# Patient Record
Sex: Female | Born: 1990 | Race: White | Hispanic: No | Marital: Married | Smoking: Former smoker
Health system: Southern US, Community
[De-identification: ages and names within clinical notes are randomized; demographics above are authoritative.]

## PROBLEM LIST (undated history)

## (undated) ENCOUNTER — Inpatient Hospital Stay: Payer: Self-pay

## (undated) DIAGNOSIS — R87619 Unspecified abnormal cytological findings in specimens from cervix uteri: Secondary | ICD-10-CM

## (undated) DIAGNOSIS — B9689 Other specified bacterial agents as the cause of diseases classified elsewhere: Secondary | ICD-10-CM

## (undated) DIAGNOSIS — N926 Irregular menstruation, unspecified: Secondary | ICD-10-CM

## (undated) DIAGNOSIS — N760 Acute vaginitis: Secondary | ICD-10-CM

## (undated) HISTORY — PX: APPENDECTOMY: SHX54

## (undated) HISTORY — DX: Other specified bacterial agents as the cause of diseases classified elsewhere: B96.89

## (undated) HISTORY — DX: Unspecified abnormal cytological findings in specimens from cervix uteri: R87.619

## (undated) HISTORY — DX: Other specified bacterial agents as the cause of diseases classified elsewhere: N76.0

## (undated) HISTORY — DX: Irregular menstruation, unspecified: N92.6

---

## 2013-12-03 ENCOUNTER — Emergency Department: Payer: Self-pay | Admitting: Emergency Medicine

## 2013-12-03 LAB — COMPREHENSIVE METABOLIC PANEL
ALBUMIN: 4.2 g/dL (ref 3.4–5.0)
AST: 13 U/L — AB (ref 15–37)
Alkaline Phosphatase: 63 U/L
Anion Gap: 5 — ABNORMAL LOW (ref 7–16)
BUN: 12 mg/dL (ref 7–18)
Bilirubin,Total: 1 mg/dL (ref 0.2–1.0)
CALCIUM: 9.7 mg/dL (ref 8.5–10.1)
CHLORIDE: 102 mmol/L (ref 98–107)
Co2: 30 mmol/L (ref 21–32)
Creatinine: 0.75 mg/dL (ref 0.60–1.30)
EGFR (Non-African Amer.): >60
GLUCOSE: 111 mg/dL — AB (ref 65–99)
Osmolality: 274 (ref 275–301)
POTASSIUM: 4.1 mmol/L (ref 3.5–5.1)
SGPT (ALT): 21 U/L (ref 12–78)
Sodium: 137 mmol/L (ref 136–145)
Total Protein: 8.9 g/dL — ABNORMAL HIGH (ref 6.4–8.2)

## 2013-12-03 LAB — CBC WITH DIFFERENTIAL/PLATELET
Basophil #: 0.1 10*3/uL (ref 0.0–0.1)
Basophil %: 0.6 %
Eosinophil #: 0.1 10*3/uL (ref 0.0–0.7)
Eosinophil %: 0.9 %
HCT: 44.1 % (ref 35.0–47.0)
HGB: 14.6 g/dL (ref 12.0–16.0)
Lymphocyte #: 1 10*3/uL (ref 1.0–3.6)
Lymphocyte %: 7.1 %
MCH: 29.5 pg (ref 26.0–34.0)
MCHC: 33.1 g/dL (ref 32.0–36.0)
MCV: 89 fL (ref 80–100)
MONOS PCT: 6.4 %
Monocyte #: 0.9 x10 3/mm (ref 0.2–0.9)
Neutrophil #: 11.4 10*3/uL — ABNORMAL HIGH (ref 1.4–6.5)
Neutrophil %: 85 %
PLATELETS: 284 10*3/uL (ref 150–440)
RBC: 4.94 10*6/uL (ref 3.80–5.20)
RDW: 13.2 % (ref 11.5–14.5)
WBC: 13.5 10*3/uL — AB (ref 3.6–11.0)

## 2013-12-03 LAB — URINALYSIS, COMPLETE
Bilirubin,UR: NEGATIVE
GLUCOSE, UR: NEGATIVE mg/dL (ref 0–75)
Ketone: NEGATIVE
Nitrite: NEGATIVE
Ph: 6 (ref 4.5–8.0)
Protein: NEGATIVE
SPECIFIC GRAVITY: 1.026 (ref 1.003–1.030)
WBC UR: 3 /HPF (ref 0–5)

## 2013-12-03 LAB — LIPASE, BLOOD: Lipase: 68 U/L — ABNORMAL LOW (ref 73–393)

## 2013-12-05 LAB — URINE CULTURE

## 2013-12-25 ENCOUNTER — Emergency Department: Payer: Self-pay | Admitting: Emergency Medicine

## 2013-12-25 LAB — LIPASE, BLOOD: LIPASE: 98 U/L (ref 73–393)

## 2013-12-25 LAB — GC/CHLAMYDIA PROBE AMP

## 2013-12-25 LAB — CBC
HCT: 40.5 % (ref 35.0–47.0)
HGB: 13.5 g/dL (ref 12.0–16.0)
MCH: 29.5 pg (ref 26.0–34.0)
MCHC: 33.4 g/dL (ref 32.0–36.0)
MCV: 88 fL (ref 80–100)
Platelet: 317 10*3/uL (ref 150–440)
RBC: 4.59 10*6/uL (ref 3.80–5.20)
RDW: 13 % (ref 11.5–14.5)
WBC: 9.9 10*3/uL (ref 3.6–11.0)

## 2013-12-25 LAB — URINALYSIS, COMPLETE
BILIRUBIN, UR: NEGATIVE
Bacteria: NONE SEEN
GLUCOSE, UR: NEGATIVE mg/dL (ref 0–75)
Ketone: NEGATIVE
NITRITE: NEGATIVE
Ph: 5 (ref 4.5–8.0)
Protein: NEGATIVE
RBC,UR: NONE SEEN /HPF (ref 0–5)
SPECIFIC GRAVITY: 1.03 (ref 1.003–1.030)
Squamous Epithelial: NONE SEEN
WBC UR: NONE SEEN /HPF (ref 0–5)

## 2013-12-25 LAB — COMPREHENSIVE METABOLIC PANEL
ALT: 15 U/L (ref 12–78)
AST: 21 U/L (ref 15–37)
Albumin: 3.9 g/dL (ref 3.4–5.0)
Alkaline Phosphatase: 56 U/L
Anion Gap: 6 — ABNORMAL LOW (ref 7–16)
BUN: 12 mg/dL (ref 7–18)
Bilirubin,Total: 0.3 mg/dL (ref 0.2–1.0)
CALCIUM: 9.3 mg/dL (ref 8.5–10.1)
CREATININE: 0.9 mg/dL (ref 0.60–1.30)
Chloride: 104 mmol/L (ref 98–107)
Co2: 28 mmol/L (ref 21–32)
EGFR (Non-African Amer.): >60
Glucose: 95 mg/dL (ref 65–99)
Osmolality: 275 (ref 275–301)
Potassium: 3.8 mmol/L (ref 3.5–5.1)
Sodium: 138 mmol/L (ref 136–145)
Total Protein: 8.2 g/dL (ref 6.4–8.2)

## 2013-12-25 LAB — WET PREP, GENITAL

## 2013-12-25 LAB — HCG, QUANTITATIVE, PREGNANCY: Beta Hcg, Quant.: <1 m[IU]/mL — ABNORMAL LOW

## 2014-12-04 DIAGNOSIS — J45909 Unspecified asthma, uncomplicated: Secondary | ICD-10-CM | POA: Insufficient documentation

## 2014-12-20 ENCOUNTER — Emergency Department
Admission: EM | Admit: 2014-12-20 | Discharge: 2014-12-21 | Disposition: A | Discharge status: Home / Self Care | Attending: Emergency Medicine | Admitting: Emergency Medicine

## 2014-12-20 DIAGNOSIS — R109 Unspecified abdominal pain: Secondary | ICD-10-CM | POA: Diagnosis present

## 2014-12-20 DIAGNOSIS — R52 Pain, unspecified: Secondary | ICD-10-CM

## 2014-12-20 DIAGNOSIS — Z3202 Encounter for pregnancy test, result negative: Secondary | ICD-10-CM | POA: Diagnosis not present

## 2014-12-20 DIAGNOSIS — K529 Noninfective gastroenteritis and colitis, unspecified: Secondary | ICD-10-CM

## 2014-12-20 LAB — CBC WITH DIFFERENTIAL/PLATELET
Basophils Absolute: 0.1 10*3/uL (ref 0–0.1)
Basophils Relative: 1 %
Eosinophils Absolute: 0.3 10*3/uL (ref 0–0.7)
Eosinophils Relative: 2 %
HEMATOCRIT: 44.9 % (ref 35.0–47.0)
HEMOGLOBIN: 14.6 g/dL (ref 12.0–16.0)
Lymphocytes Relative: 27 %
Lymphs Abs: 3.2 10*3/uL (ref 1.0–3.6)
MCH: 28.7 pg (ref 26.0–34.0)
MCHC: 32.4 g/dL (ref 32.0–36.0)
MCV: 88.6 fL (ref 80.0–100.0)
MONOS PCT: 6 %
Monocytes Absolute: 0.8 10*3/uL (ref 0.2–0.9)
NEUTROS ABS: 7.6 10*3/uL — AB (ref 1.4–6.5)
Neutrophils Relative %: 64 %
Platelets: 342 10*3/uL (ref 150–440)
RBC: 5.06 MIL/uL (ref 3.80–5.20)
RDW: 13.1 % (ref 11.5–14.5)
WBC: 12 10*3/uL — ABNORMAL HIGH (ref 3.6–11.0)

## 2014-12-20 LAB — COMPREHENSIVE METABOLIC PANEL
ALK PHOS: 54 U/L (ref 38–126)
ALT: 14 U/L (ref 14–54)
AST: 21 U/L (ref 15–41)
Albumin: 4.3 g/dL (ref 3.5–5.0)
Anion gap: 10 (ref 5–15)
BUN: 10 mg/dL (ref 6–20)
CHLORIDE: 100 mmol/L — AB (ref 101–111)
CO2: 30 mmol/L (ref 22–32)
Calcium: 9.6 mg/dL (ref 8.9–10.3)
Creatinine, Ser: 0.83 mg/dL (ref 0.44–1.00)
Glucose, Bld: 103 mg/dL — ABNORMAL HIGH (ref 65–99)
Potassium: 4 mmol/L (ref 3.5–5.1)
Sodium: 140 mmol/L (ref 135–145)
Total Bilirubin: 0.6 mg/dL (ref 0.3–1.2)
Total Protein: 8.4 g/dL — ABNORMAL HIGH (ref 6.5–8.1)

## 2014-12-20 LAB — URINALYSIS COMPLETE WITH MICROSCOPIC (ARMC ONLY)
BACTERIA UA: NONE SEEN
BILIRUBIN URINE: NEGATIVE
GLUCOSE, UA: NEGATIVE mg/dL
HGB URINE DIPSTICK: NEGATIVE
Ketones, ur: NEGATIVE mg/dL
NITRITE: NEGATIVE
Protein, ur: NEGATIVE mg/dL
Specific Gravity, Urine: 1.029 (ref 1.005–1.030)
pH: 7 (ref 5.0–8.0)

## 2014-12-20 LAB — LIPASE, BLOOD: Lipase: 30 U/L (ref 22–51)

## 2014-12-20 LAB — POCT PREGNANCY, URINE: Preg Test, Ur: NEGATIVE

## 2014-12-20 MED ORDER — SODIUM CHLORIDE 0.9 % IV BOLUS (SEPSIS)
1000.0000 mL | Freq: Once | INTRAVENOUS | Status: AC
Start: 2014-12-21 — End: 2014-12-21
  Administered 2014-12-21: 1000 mL via INTRAVENOUS

## 2014-12-20 MED ORDER — DICYCLOMINE HCL 10 MG PO CAPS
10.0000 mg | ORAL_CAPSULE | Freq: Once | ORAL | Status: AC
  Administered 2014-12-21: 10 mg via ORAL

## 2014-12-20 NOTE — ED Notes (Signed)
Patient c/o feeling of acid reflux to mid-chest and nausea and epigastric pain. Patient describes feeling as "like you ate something bad," and reports the pain is constant. Patient reports one episode of diarrhea and states she has never vomited.

## 2014-12-20 NOTE — ED Notes (Signed)
PT arrived to ED with reported abdominal pain and nausea x 1 day. Pt c/o feeling "achy and bloated". Pt reports pain is constant. Rates pain 6/10.

## 2014-12-20 NOTE — ED Provider Notes (Signed)
[facility] Emergency Department Provider Note  ____________________________________________  Time seen: 11:40 PM  I have reviewed the triage vital signs and the nursing notes.   HISTORY  Chief Complaint Abdominal Pain      HPI Lindsay Carpenter is a 24 y.o. female presents with cramp-like generalized abdominal discomfort 1 day. In addition patient admits to diarrhea this morning. Patient denies fever no vomiting. Of note patient's child recently had similar illness.     History reviewed. No pertinent past medical history.  There are no active problems to display for this patient.   History reviewed. No pertinent past surgical history.  No current outpatient prescriptions on file.  Allergies Motrin  No family history on file.  Social History History  Substance Use Topics  . Smoking status: Never Smoker   . Smokeless tobacco: Never Used  . Alcohol Use: No    Review of Systems  Constitutional: Negative for fever. Eyes: Negative for visual changes. ENT: Negative for sore throat. Cardiovascular: Negative for chest pain. Respiratory: Negative for shortness of breath. Gastrointestinal: Positive for abdominal pain,  positive diarrhea. Genitourinary: Negative for dysuria. Musculoskeletal: Negative for back pain. Skin: Negative for rash. Neurological: Negative for headaches, focal weakness or numbness.   10-point ROS otherwise negative.  ____________________________________________   PHYSICAL EXAM:  VITAL SIGNS: ED Triage Vitals  Enc Vitals Group     BP 12/20/14 2105 112/72 mmHg     Pulse Rate 12/20/14 2105 60     Resp 12/20/14 2105 16     Temp 12/20/14 2105 98.1 F (36.7 C)     Temp Source 12/20/14 2105 Oral     SpO2 12/20/14 2105 99 %     Weight 12/20/14 2105 184 lb (83.462 kg)     Height 12/20/14 2105 5\' 8"  (1.727 m)     Head Cir --      Peak Flow --      Pain Score --      Pain Loc --      Pain Edu? --      Excl. in  GC? --     Constitutional: Alert and oriented. Well appearing and in no distress. Eyes: Conjunctivae are normal. PERRL. Normal extraocular movements. ENT   Head: Normocephalic and atraumatic.   Nose: No congestion/rhinnorhea.   Mouth/Throat: Mucous membranes are moist.   Neck: No stridor. Cardiovascular: Normal rate, regular rhythm. Normal and symmetric distal pulses are present in all extremities. No murmurs, rubs, or gallops. Respiratory: Normal respiratory effort without tachypnea nor retractions. Breath sounds are clear and equal bilaterally. No wheezes/rales/rhonchi. Gastrointestinal: Soft and nontender. No distention. There is no CVA tenderness. Genitourinary: deferred Musculoskeletal: Nontender with normal range of motion in all extremities. No joint effusions.  No lower extremity tenderness nor edema. Neurologic:  Normal speech and language. No gross focal neurologic deficits are appreciated. Speech is normal.  Skin:  Skin is warm, dry and intact. No rash noted. Psychiatric: Mood and affect are normal. Speech and behavior are normal. Patient exhibits appropriate insight and judgment.  ____________________________________________    LABS (pertinent positives/negatives)  Labs Reviewed  COMPREHENSIVE METABOLIC PANEL - Abnormal; Notable for the following:    Chloride 100 (*)    Glucose, Bld 103 (*)    Total Protein 8.4 (*)    All other components within normal limits  URINALYSIS COMPLETEWITH MICROSCOPIC ([facility])  - Abnormal; Notable for the following:    Color, Urine YELLOW (*)    APPearance HAZY (*)    Leukocytes, UA  TRACE (*)    Squamous Epithelial / LPF 6-30 (*)    All other components within normal limits  CBC WITH DIFFERENTIAL/PLATELET - Abnormal; Notable for the following:    WBC 12.0 (*)    Neutro Abs 7.6 (*)    All other components within normal limits  LIPASE, BLOOD  POC URINE PREG, ED  POCT PREGNANCY, URINE      ____________________________________________     RADIOLOGY  Right upper quadrant ultrasound negative  ____________________________________________      INITIAL IMPRESSION / ASSESSMENT AND PLAN / ED COURSE  Pertinent labs & imaging results that were available during my care of the patient were reviewed by me and considered in my medical decision making (see chart for details).  History and physical exam consistent with possible gastroenteritis. However we'll perform ultrasound of the gallbladder given right upper quadrant pain and family history of gallstones which was negative  ____________________________________________   FINAL CLINICAL IMPRESSION(S) / ED DIAGNOSES  Final diagnoses:  Pain  Acute gastroenteritis      Darci Currentandolph N Brown, MD 01/03/15 [phone]

## 2014-12-21 ENCOUNTER — Emergency Department

## 2014-12-21 MED ORDER — DICYCLOMINE HCL 10 MG PO CAPS
ORAL_CAPSULE | ORAL | Status: AC
  Administered 2014-12-21: 10 mg via ORAL
  Filled 2014-12-21: qty 1

## 2014-12-21 NOTE — Discharge Instructions (Signed)

## 2014-12-25 ENCOUNTER — Encounter: Payer: Self-pay | Admitting: Urgent Care

## 2014-12-25 DIAGNOSIS — S82891A Other fracture of right lower leg, initial encounter for closed fracture: Secondary | ICD-10-CM | POA: Insufficient documentation

## 2014-12-25 DIAGNOSIS — Y998 Other external cause status: Secondary | ICD-10-CM | POA: Insufficient documentation

## 2014-12-25 DIAGNOSIS — Y9289 Other specified places as the place of occurrence of the external cause: Secondary | ICD-10-CM | POA: Insufficient documentation

## 2014-12-25 DIAGNOSIS — W109XXA Fall (on) (from) unspecified stairs and steps, initial encounter: Secondary | ICD-10-CM | POA: Insufficient documentation

## 2014-12-25 DIAGNOSIS — Y9389 Activity, other specified: Secondary | ICD-10-CM | POA: Insufficient documentation

## 2014-12-25 NOTE — ED Notes (Signed)
Patient presents with c/o RIGHT ankle pain s/p falling down stairs. Patient with (+) PMS noted; foot warm and dry.

## 2014-12-26 ENCOUNTER — Encounter: Payer: Self-pay | Admitting: Emergency Medicine

## 2014-12-26 ENCOUNTER — Emergency Department

## 2014-12-26 ENCOUNTER — Emergency Department
Admission: EM | Admit: 2014-12-26 | Discharge: 2014-12-26 | Disposition: A | Discharge status: Home / Self Care | Attending: Emergency Medicine | Admitting: Emergency Medicine

## 2014-12-26 DIAGNOSIS — T148XXA Other injury of unspecified body region, initial encounter: Secondary | ICD-10-CM

## 2014-12-26 DIAGNOSIS — S82891A Other fracture of right lower leg, initial encounter for closed fracture: Secondary | ICD-10-CM

## 2014-12-26 MED ORDER — FENTANYL CITRATE (PF) 100 MCG/2ML IJ SOLN
INTRAMUSCULAR | Status: AC
Start: 2014-12-26 — End: 2014-12-26
  Administered 2014-12-26: 25 ug via INTRAMUSCULAR
  Filled 2014-12-26: qty 2

## 2014-12-26 MED ORDER — ONDANSETRON 4 MG PO TBDP
4.0000 mg | ORAL_TABLET | Freq: Once | ORAL | Status: AC
  Administered 2014-12-26: 4 mg via ORAL

## 2014-12-26 MED ORDER — OXYCODONE-ACETAMINOPHEN 5-325 MG PO TABS: 1.0000 | ORAL_TABLET | ORAL | Status: DC | PRN

## 2014-12-26 MED ORDER — ONDANSETRON 4 MG PO TBDP
ORAL_TABLET | ORAL | Status: AC
  Administered 2014-12-26: 4 mg via ORAL
  Filled 2014-12-26: qty 1

## 2014-12-26 MED ORDER — FENTANYL CITRATE (PF) 100 MCG/2ML IJ SOLN
25.0000 ug | Freq: Once | INTRAMUSCULAR | Status: AC
  Administered 2014-12-26: 25 ug via INTRAMUSCULAR

## 2014-12-26 NOTE — Discharge Instructions (Signed)
Ankle Fracture  A fracture is a break in a bone. The ankle joint is made up of three bones. These include the lower (distal)sections of your lower leg bones, called the tibia and fibula, along with a bone in your foot, called the talus. Depending on how bad the break is and if more than one ankle joint bone is broken, a cast or splint is used to protect and keep your injured bone from moving while it heals. Sometimes, surgery is required to help the fracture heal properly.   There are two general types of fractures:   Stable fracture. This includes a single fracture line through one bone, with no injury to ankle ligaments. A fracture of the talus that does not have any displacement (movement of the bone on either side of the fracture line) is also stable.   Unstable fracture. This includes more than one fracture line through one or more bones in the ankle joint. It also includes fractures that have displacement of the bone on either side of the fracture line.  CAUSES   A direct blow to the ankle.    Quickly and severely twisting your ankle.   Trauma, such as a car accident or falling from a significant height.  RISK FACTORS  You may be at a higher risk of ankle fracture if:   You have certain medical conditions.   You are involved in high-impact sports.   You are involved in a high-impact car accident.  SIGNS AND SYMPTOMS    Tender and swollen ankle.   Bruising around the injured ankle.   Pain on movement of the ankle.   Difficulty walking or putting weight on the ankle.   A cold foot below the site of the ankle injury. This can occur if the blood vessels passing through your injured ankle were also damaged.   Numbness in the foot below the site of the ankle injury.  DIAGNOSIS   An ankle fracture is usually diagnosed with a physical exam and X-rays. A CT scan may also be required for complex fractures.  TREATMENT   Stable fractures are treated with a cast or splint and using crutches to avoid putting  weight on your injured ankle. This is followed by an ankle strengthening program. Some patients require a special type of cast, depending on other medical problems they may have. Unstable fractures require surgery to ensure the bones heal properly. Your health care provider will tell you what type of fracture you have and the best treatment for your condition.  HOME CARE INSTRUCTIONS    Review correct crutch use with your health care provider and use your crutches as directed. Safe use of crutches is extremely important. Misuse of crutches can cause you to fall or cause injury to nerves in your hands or armpits.   Do not put weight or pressure on the injured ankle until directed by your health care provider.   To lessen the swelling, keep the injured leg elevated while sitting or lying down.   Apply ice to the injured area:   Put ice in a plastic bag.   Place a towel between your cast and the bag.   Leave the ice on for 20 minutes, 2-3 times a day.   If you have a plaster or fiberglass cast:   Do not try to scratch the skin under the cast with any objects. This can increase your risk of skin infection.   Check the skin around the cast every day. You   may put lotion on any red or sore areas.   Keep your cast dry and clean.   If you have a plaster splint:   Wear the splint as directed.   You may loosen the elastic around the splint if your toes become numb, tingle, or turn cold or blue.   Do not put pressure on any part of your cast or splint; it may break. Rest your cast only on a pillow the first 24 hours until it is fully hardened.   Your cast or splint can be protected during bathing with a plastic bag sealed to your skin with medical tape. Do not lower the cast or splint into water.   Take medicines as directed by your health care provider. Only take over-the-counter or prescription medicines for pain, discomfort, or fever as directed by your health care provider.   Do not drive a vehicle until  your health care provider specifically tells you it is safe to do so.   If your health care provider has given you a follow-up appointment, it is very important to keep that appointment. Not keeping the appointment could result in a chronic or permanent injury, pain, and disability. If you have any problem keeping the appointment, call the facility for assistance.  SEEK MEDICAL CARE IF:  You develop increased swelling or discomfort.  SEEK IMMEDIATE MEDICAL CARE IF:    Your cast gets damaged or breaks.   You have continued severe pain.   You develop new pain or swelling after the cast was put on.   Your skin or toenails below the injury turn blue or gray.   Your skin or toenails below the injury feel cold, numb, or have loss of sensitivity to touch.   There is a bad smell or pus draining from under the cast.  MAKE SURE YOU:    Understand these instructions.   Will watch your condition.   Will get help right away if you are not doing well or get worse.  Document Released: 07/17/2000 Document Revised: 07/25/2013 Document Reviewed: 02/16/2013  [employer] Patient Information 2015 [employer]. This information is not intended to replace advice given to you by your health care provider. Make sure you discuss any questions you have with your health care provider.

## 2014-12-26 NOTE — ED Provider Notes (Signed)
[facility] Emergency Department Provider Note  ____________________________________________  Time seen: 3:15 AM  I have reviewed the triage vital signs and the nursing notes.   HISTORY  Chief Complaint Ankle Pain      HPI Lindsay Carpenter is a 24 y.o. female presents with sharp 10 out of 10 right ankle pain status post falling off a step twisting ankle.     History reviewed. No pertinent past medical history.  There are no active problems to display for this patient.   Past Surgical History  Procedure Laterality Date  . Cesarean section      2014    No current outpatient prescriptions on file.  Allergies Motrin  No family history on file.  Social History History  Substance Use Topics  . Smoking status: Never Smoker   . Smokeless tobacco: Never Used  . Alcohol Use: No    Review of Systems  Constitutional: Negative for fever. Eyes: Negative for visual changes. ENT: Negative for sore throat. Cardiovascular: Negative for chest pain. Respiratory: Negative for shortness of breath. Gastrointestinal: Negative for abdominal pain, vomiting and diarrhea. Genitourinary: Negative for dysuria. Musculoskeletal: Negative for back pain. Positive right ankle pain Skin: Negative for rash. Neurological: Negative for headaches, focal weakness or numbness.  10-point ROS otherwise negative.  ____________________________________________   PHYSICAL EXAM:  VITAL SIGNS: ED Triage Vitals  Enc Vitals Group     BP 12/25/14 2356 124/80 mmHg     Pulse Rate 12/25/14 2356 81     Resp 12/25/14 2356 18     Temp 12/25/14 2356 98.6 F (37 C)     Temp Source 12/25/14 2356 Oral     SpO2 12/25/14 2356 100 %     Weight 12/25/14 2356 175 lb (79.379 kg)     Height 12/25/14 2356 5\' 8"  (1.727 m)     Head Cir --      Peak Flow --      Pain Score 12/25/14 2357 8     Pain Loc --      Pain Edu? --      Excl. in GC? --      Constitutional: Alert and  oriented. Well appearing and in no distress. Eyes: Conjunctivae are normal. PERRL. Normal extraocular movements. ENT   Head: Normocephalic and atraumatic.   Nose: No congestion/rhinnorhea.   Mouth/Throat: Mucous membranes are moist.   Neck: No stridor. Hematological/Lymphatic/Immunilogical: No cervical lymphadenopathy. Cardiovascular: Normal rate, regular rhythm. Normal and symmetric distal pulses are present in all extremities. No murmurs, rubs, or gallops. Respiratory: Normal respiratory effort without tachypnea nor retractions. Breath sounds are clear and equal bilaterally. No wheezes/rales/rhonchi. Gastrointestinal: Soft and nontender. No distention. There is no CVA tenderness. Genitourinary: deferred Musculoskeletal: Nontender with normal range of motion in all extremities. No joint effusions.  No lower extremity tenderness nor edema. Neurologic:  Normal speech and language. No gross focal neurologic deficits are appreciated. Speech is normal.  Skin:  Skin is warm, dry and intact. No rash noted. Psychiatric: Mood and affect are normal. Speech and behavior are normal. Patient exhibits appropriate insight and judgment.  ____________________________________________        RADIOLOGY  Possible right lateral malleoli avulsion fracture.  ____________________________________________   Procedure: Sugar tong splint applied  ____________________________________________   INITIAL IMPRESSION / ASSESSMENT AND PLAN / ED COURSE  Pertinent labs & imaging results that were available during my care of the patient were reviewed by me and considered in my medical decision making (see chart for details).  History of physical exam consistent with right malleoli avulsion fracture secondary to twisting injury. Patient received fentanyl IM analgesia. Sugar tong splint applied  ____________________________________________   FINAL CLINICAL IMPRESSION(S) / ED DIAGNOSES  Final  diagnoses:  Avulsion fracture of ankle, right, closed, initial encounter  Avulsion fracture      Darci Current, MD 12/26/14 2313

## 2014-12-26 NOTE — ED Notes (Signed)

## 2015-11-06 ENCOUNTER — Encounter: Payer: Self-pay | Admitting: Obstetrics and Gynecology

## 2015-11-06 ENCOUNTER — Ambulatory Visit (INDEPENDENT_AMBULATORY_CARE_PROVIDER_SITE_OTHER): Payer: BLUE CROSS/BLUE SHIELD | Admitting: Obstetrics and Gynecology

## 2015-11-06 VITALS — BP 119/74 | HR 60 | Ht 68.0 in | Wt 205.8 lb

## 2015-11-06 DIAGNOSIS — N926 Irregular menstruation, unspecified: Secondary | ICD-10-CM | POA: Diagnosis not present

## 2015-11-06 DIAGNOSIS — R87619 Unspecified abnormal cytological findings in specimens from cervix uteri: Secondary | ICD-10-CM | POA: Diagnosis not present

## 2015-11-06 DIAGNOSIS — N979 Female infertility, unspecified: Secondary | ICD-10-CM

## 2015-11-08 ENCOUNTER — Encounter: Payer: Self-pay | Admitting: Obstetrics and Gynecology

## 2015-11-08 DIAGNOSIS — R87619 Unspecified abnormal cytological findings in specimens from cervix uteri: Secondary | ICD-10-CM

## 2015-11-08 HISTORY — DX: Unspecified abnormal cytological findings in specimens from cervix uteri: R87.619

## 2015-11-08 LAB — PROLACTIN: PROLACTIN: 15.8 ng/mL (ref 4.8–23.3)

## 2015-11-08 LAB — TESTOSTERONE, FREE, TOTAL, SHBG
Sex Hormone Binding: 28.8 nmol/L (ref 24.6–122.0)
Testosterone, Free: 4.9 pg/mL — ABNORMAL HIGH (ref 0.0–4.2)
Testosterone: 67 ng/dL — ABNORMAL HIGH (ref 8–48)

## 2015-11-08 LAB — FSH/LH
FSH: 5.6 m[IU]/mL
LH: 10.2 m[IU]/mL

## 2015-11-08 LAB — DHEA-SULFATE: DHEA-SO4: 417.8 ug/dL — ABNORMAL HIGH (ref 84.8–378.0)

## 2015-11-08 NOTE — Progress Notes (Signed)
GYNECOLOGY CLINIC PROGRESS NOTE  Subjective:     Lindsay Carpenter is a 25 y.o. [id number] female who presents for irregular menses. Patient's last menstrual period was 07/28/2015. Menarche age: [age]. Periods are irregular, occuring q 2-3 months for the past 6 months.  Notes that prior to this, her cycles were regularly occuring.  Dysmenorrhea:mild, occurring first 1-2 days of flow.  Current contraception: none. Notes that she and husband have been intermittently attempting to conceive over the past year. History of abnormal Pap smear: yes - December 2016, patient [place] recall exact abnormality [place] but notes that she was told she just needed to repeat her pap mere in 1 year.    Past Medical History  Diagnosis Date  . Irregular periods   . Abnormal Pap smear of cervix 11/08/2015    Family History  Problem Relation Age of Onset  . Polycystic ovary syndrome Mother      Past Surgical History  Procedure Laterality Date  . Cesarean section      2014    Social History   Social History  . Marital Status: Married    Spouse Name: N/A  . Number of Children: N/A  . Years of Education: N/A   Occupational History  . Not on file.   Social History Main Topics  . Smoking status: Former Smoker    Quit date: 01/01/2013  . Smokeless tobacco: Never Used  . Alcohol Use: No  . Drug Use: No  . Sexual Activity: Yes    Birth Control/ Protection: None   Other Topics Concern  . Not on file   Social History Narrative    Outpatient Encounter Prescriptions as of 11/06/2015  Medication Sig  . [DISCONTINUED] oxyCODONE-acetaminophen (ROXICET) 5-325 MG per tablet Take 1 tablet by mouth every 4 (four) hours as needed for severe pain.   No facility-administered encounter medications on file as of 11/06/2015.     Allergies  Allergen Reactions  . Motrin [Ibuprofen] Anaphylaxis     Review of Systems A comprehensive review of systems was negative except for: Genitourinary:  positive for abnormal menstrual periods and ovulation pain, and increased vaginal discharge, without odor or itching     Objective:    General appearance: alert and no distress Abdomen: soft, non-tender; bowel sounds normal; no masses,  no organomegaly Pelvic: cervix normal in appearance, external genitalia normal, no adnexal masses or tenderness, no cervical motion tenderness, positive findings: vaginal discharge:  normal and physiologic, rectovaginal septum normal and uterus normal size, shape, and consistency Extremities: extremities normal, atraumatic, no cyanosis or edema Pulses: 2+ and symmetric Skin: Skin color, texture, turgor normal. No rashes or lesions   Neurologic: grossly intact.     Microscopic wet-mount exam shows negative for pathogens, normal epithelial cells.  Assessment:    The patient has oligomenorrhea.    Infertility, secondary H/o abnormal pap smear   Plan:  Diagnosis explained in detail, including differential.  Ovulatory dysfunction likely cause, could be due to underlying disorder such as PCOS. Patient with family h/o PCOS.  Will order labs and ultrasound.   All questions answered. Agricultural engineerducational material distributed. Pregnancy test, result: negative   Will give patient trial of Provera to induce withdrawal bleeding.   Patient with h/o recent abnormal pap, was told to have repeat in 1 year.  Will repeat in December.  Patient to complete medical record release to get pap results.  To f/u in 2-3 weeks to discuss results and further management of infertility.  Rubie Maid, MD [facility]

## 2015-11-13 ENCOUNTER — Other Ambulatory Visit: Payer: BLUE CROSS/BLUE SHIELD

## 2015-11-20 ENCOUNTER — Ambulatory Visit (INDEPENDENT_AMBULATORY_CARE_PROVIDER_SITE_OTHER): Payer: BLUE CROSS/BLUE SHIELD

## 2015-11-20 DIAGNOSIS — N926 Irregular menstruation, unspecified: Secondary | ICD-10-CM

## 2015-11-22 ENCOUNTER — Telehealth: Payer: Self-pay | Admitting: Obstetrics and Gynecology

## 2015-11-22 NOTE — Telephone Encounter (Signed)
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 Can you call her with Results. US was on 4/19.

## 2015-11-27 NOTE — Telephone Encounter (Signed)
Please inform patient her ultrasound was normal, no evidence of cysts or PCOS on scan.  Will discuss all of her other results with her at her appointment on 12/05/15.

## 2015-11-27 NOTE — Telephone Encounter (Signed)
Called pt informed her of normal u/s and the need to keep f/u appt to discuss labs. Pt states she can come 12/11/15 at 11am. Please add pt schedule.

## 2015-11-27 NOTE — Telephone Encounter (Signed)
Please advise/ see previous note

## 2015-12-05 ENCOUNTER — Ambulatory Visit: Payer: BLUE CROSS/BLUE SHIELD | Admitting: Obstetrics and Gynecology

## 2015-12-11 ENCOUNTER — Ambulatory Visit (INDEPENDENT_AMBULATORY_CARE_PROVIDER_SITE_OTHER): Payer: BLUE CROSS/BLUE SHIELD | Admitting: Obstetrics and Gynecology

## 2015-12-11 ENCOUNTER — Encounter: Payer: Self-pay | Admitting: Obstetrics and Gynecology

## 2015-12-11 VITALS — BP 92/64 | HR 55 | Ht 68.0 in | Wt 205.7 lb

## 2015-12-11 DIAGNOSIS — R6882 Decreased libido: Secondary | ICD-10-CM

## 2015-12-11 DIAGNOSIS — E288 Other ovarian dysfunction: Secondary | ICD-10-CM

## 2015-12-11 DIAGNOSIS — N970 Female infertility associated with anovulation: Secondary | ICD-10-CM | POA: Diagnosis not present

## 2015-12-11 NOTE — Progress Notes (Signed)
GYNECOLOGY PROGRESS NOTE  Subjective:    Patient ID: Lindsay Carpenter, female    DOB: 1991/01/14, 25 y.o.   MRN: [id number]  HPI  Patient is a 24 y.o. G54P1021 female who presents for discussion of ultrasound and lab results for workup of infertility. Patient notes that she had a period on her own 11/22/2015 after no cycles x 4 months (did not have to take Provera tablets).  Notes that period lasted ~ 7 days, and was old dark blood.     The following portions of the patient's history were reviewed and updated as appropriate: allergies, current medications, past family history, past medical history, past social history, past surgical history and problem list.  Review of Systems A comprehensive review of systems was negative except for: Genitourinary: positive for decreased libido x 4-5 months   Objective:   Blood pressure 92/64, pulse 55, height  (1.727 m), weight 205 lb 11.2 oz (93.305 kg), last menstrual period 11/22/2015. General appearance: alert and no distress Exam deferred.    Labs:  Results for orders placed or performed in visit on 11/06/15  DHEA-sulfate  Result Value Ref Range   DHEA-SO4 417.8 (H) 84.8 - 378.0 ug/dL  Testosterone, Free, Total, SHBG  Result Value Ref Range   Testosterone 67 (H) 8 - 48 ng/dL   Testosterone, Free 4.9 (H) 0.0 - 4.2 pg/mL   Sex Hormone Binding 28.8 24.6 - 122.0 nmol/L  Prolactin  Result Value Ref Range   Prolactin 15.8 4.8 - 23.3 ng/mL  FSH/LH  Result Value Ref Range   LH 10.2 mIU/mL   FSH 5.6 mIU/mL    Imaging (11/20/2015):  Indications: Irregular menses Findings:  The uterus measures 7.5 x 4.6 x 5.6 cm and appears WNL. Echo texture is homogenous without evidence of focal masses.  The Endometrium appears WNL and measures 5.1 mm.  Right Ovary measures 3.3 x 1.3 x 3.0 cm. There are multiple follicles seen, but does not appear to have the typical appearance of PCOS. Left Ovary measures 3.6 x 1.7 x 2.1 cm. Appears the same as  the right ovary.  Survey of the adnexa demonstrates no adnexal masses. There is no free fluid in the cul de sac.  Impression: 1. Normal appearing pelvic ultrasound. Ovaries appear WNL. 2. Incidentally, during the exam the patient noted that for the last 3-4 days she has been having intermittent dark brownish/red blood, but only when she wipes.   Recommendations: 1.Clinical correlation with the patient's History and Physical Exam.   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 Discussion had with patient regarding lab and ultrasound results.  No evidence of PCOS on scan, however hyperandrogenism noted on labs. May be potential cause of delayed fertility.  Patient may likely need assistance with medications once cycles regular.  2. Patient also notes that she had to use progesterone supplements during last pregnancy in 1st trimester secondary to prior first trimester losses.  Will likely need again once pregnant.  3. Will check Day 21 progesterone level x 3.  First due on 12/12/2015.   4. To use Provera monthly x 3 months if no spontaneous cycle.  WIll need to track menstrual cycles using a menstrual calendar or phone app.  5. Advised on timed intercourse mid-cycle, use of ovulation kits.  6.  Discussed ways to help libido, including change of scenery, erotic books/movies, herbal remedies.   To f/u in 2 months.    A total  of 15 minutes were spent face-to-face with the patient during this encounter and over half of that time dealt with counseling and coordination of care.   Hildred LaserAnika Beyonka Pitney, MD [facility]

## 2015-12-11 NOTE — Patient Instructions (Signed)
Blood draw on 5/11 or 5/12 for progesterone level.  1. You will begin a medication called Provera.  You will take this medication for 7 days each month.  Begin next dose approximately 26 days from initial dose each month.  2. You will need to keep a menstrual calendar.  Be sure to mark the first day of your cycle each month.  3. On the 3rd day of your cycle (3rd day of bleeding) and 21st day of your cycle, you will need to return for lab work.  4. Begin taking a daily prenatal vitamin and folic acid (400 mcg) daily.  5. Once you have begun having regular cycles, you will need to be sure to have intercourse at least once daily specifically on Days 11-14 of your cycle.  Do not immediately get up after intercourse, remain lying down for 30 minutes.

## 2015-12-12 ENCOUNTER — Other Ambulatory Visit: Payer: BLUE CROSS/BLUE SHIELD

## 2015-12-12 DIAGNOSIS — N970 Female infertility associated with anovulation: Secondary | ICD-10-CM

## 2015-12-12 DIAGNOSIS — N926 Irregular menstruation, unspecified: Secondary | ICD-10-CM

## 2015-12-13 LAB — PROGESTERONE: Progesterone: 0.1 ng/mL

## 2015-12-16 ENCOUNTER — Telehealth: Payer: Self-pay

## 2015-12-16 DIAGNOSIS — N912 Amenorrhea, unspecified: Secondary | ICD-10-CM

## 2015-12-16 MED ORDER — MEDROXYPROGESTERONE ACETATE 10 MG PO TABS: 10.0000 mg | ORAL_TABLET | Freq: Every day | ORAL | Status: DC

## 2015-12-16 NOTE — Telephone Encounter (Signed)
-----   Message from Lindsay LaserAnika Cherry, MD sent at 12/14/2015 11:49 AM EDT ----- Please inform patient that it appears as though she did not ovulate this month.  Would recommend repeat progesterone levels for the next 2-3 months on Day 21 of cycle.

## 2015-12-16 NOTE — Telephone Encounter (Signed)
Pt calls back, informed her of no ovulation and the need to follow up with day 21 blood draws.

## 2015-12-16 NOTE — Telephone Encounter (Signed)
Called pt, no answer. Unable to leave message as no voicemail is set up.  

## 2015-12-23 ENCOUNTER — Telehealth: Payer: Self-pay | Admitting: Obstetrics and Gynecology

## 2015-12-23 NOTE — Telephone Encounter (Signed)
Pt doesn't know when to take her provera. Would you call and talk to her.

## 2015-12-23 NOTE — Telephone Encounter (Signed)
Called pt, no answer. Unable to leave message as no voicemail is set up.  

## 2016-01-29 ENCOUNTER — Ambulatory Visit: Payer: BLUE CROSS/BLUE SHIELD | Admitting: Obstetrics and Gynecology

## 2016-02-17 ENCOUNTER — Telehealth: Payer: Self-pay | Admitting: Obstetrics and Gynecology

## 2016-02-17 DIAGNOSIS — N912 Amenorrhea, unspecified: Secondary | ICD-10-CM

## 2016-02-17 NOTE — Telephone Encounter (Signed)
THIS PT IS ON PROVERA AND SHE HAS HAD IRR PERIODS AND REALLY DON'T KNOW WHEN TO HAVE HER BLOOD DRAWN FOR OVULATION. SHE IS SUPPOSED TO GO BACK ON MEDICATION WED 7/19 BUT NEEDS REFILL B/C HER APPT IS NOT UNTIL END OF MONTH WITH DR CHERRY. PROBABLY NEED TO CALL HER SO SHE CAN EXPALIN

## 2016-02-18 MED ORDER — MEDROXYPROGESTERONE ACETATE 10 MG PO TABS: 10.0000 mg | ORAL_TABLET | Freq: Every day | ORAL | Status: AC

## 2016-02-18 NOTE — Telephone Encounter (Signed)
Called pt no answer. Unable to leave message as no voicemail is set up.  

## 2016-02-18 NOTE — Telephone Encounter (Signed)
Went ahead and sent in her prescription.  Maybe the pharmacy will have luck contacting her.  We should still attempt to contact her to let her know it was done.

## 2016-02-18 NOTE — Telephone Encounter (Signed)
This pt needs to start back on medication today, will you review this and let her know

## 2016-02-19 ENCOUNTER — Encounter: Payer: Self-pay | Admitting: Obstetrics and Gynecology

## 2016-02-19 ENCOUNTER — Ambulatory Visit (INDEPENDENT_AMBULATORY_CARE_PROVIDER_SITE_OTHER): Payer: Managed Care, Other (non HMO) | Admitting: Obstetrics and Gynecology

## 2016-02-19 VITALS — BP 98/62 | HR 59 | Ht 68.0 in | Wt 203.7 lb

## 2016-02-19 DIAGNOSIS — N912 Amenorrhea, unspecified: Secondary | ICD-10-CM

## 2016-02-19 DIAGNOSIS — N970 Female infertility associated with anovulation: Secondary | ICD-10-CM

## 2016-02-19 DIAGNOSIS — Z319 Encounter for procreative management, unspecified: Secondary | ICD-10-CM | POA: Diagnosis not present

## 2016-02-19 DIAGNOSIS — E282 Polycystic ovarian syndrome: Secondary | ICD-10-CM | POA: Diagnosis not present

## 2016-02-24 ENCOUNTER — Emergency Department
Admission: EM | Admit: 2016-02-24 | Discharge: 2016-02-24 | Disposition: A | Discharge status: Home / Self Care | Payer: PRIVATE HEALTH INSURANCE | Attending: Emergency Medicine | Admitting: Emergency Medicine

## 2016-02-24 ENCOUNTER — Encounter: Payer: Self-pay | Admitting: Emergency Medicine

## 2016-02-24 DIAGNOSIS — Z79899 Other long term (current) drug therapy: Secondary | ICD-10-CM | POA: Insufficient documentation

## 2016-02-24 DIAGNOSIS — L03115 Cellulitis of right lower limb: Secondary | ICD-10-CM | POA: Insufficient documentation

## 2016-02-24 DIAGNOSIS — N970 Female infertility associated with anovulation: Secondary | ICD-10-CM | POA: Insufficient documentation

## 2016-02-24 DIAGNOSIS — Z87891 Personal history of nicotine dependence: Secondary | ICD-10-CM | POA: Insufficient documentation

## 2016-02-24 DIAGNOSIS — Z319 Encounter for procreative management, unspecified: Secondary | ICD-10-CM | POA: Insufficient documentation

## 2016-02-24 MED ORDER — CLINDAMYCIN HCL 150 MG PO CAPS
300.0000 mg | ORAL_CAPSULE | Freq: Once | ORAL | Status: AC
  Administered 2016-02-24: 300 mg via ORAL
  Filled 2016-02-24: qty 2

## 2016-02-24 MED ORDER — CLINDAMYCIN HCL 300 MG PO CAPS: 300.0000 mg | ORAL_CAPSULE | Freq: Three times a day (TID) | ORAL | 0 refills | Status: AC

## 2016-02-24 NOTE — ED Provider Notes (Signed)
[facility] Emergency Department Provider Note   ____________________________________________  Time seen: Approximately 2:30 PM I have reviewed the triage vital signs and the triage nursing note.  HISTORY  Chief Complaint Cellulitis   Historian Patient  HPI Lindsay Carpenter is a 25 y.o. female here for redness and swelling to right inner lower thigh.  Noted itchy spot yesterday and was much worse today. No fevers or nausea or vomiting. No headache confusion altered mental status.  No history of prior cellulitis or abscess. Nothing makes it worse better.    Past Medical History:  Diagnosis Date  . Abnormal Pap smear of cervix 11/08/2015  . BV (bacterial vaginosis)   . Irregular periods     Patient Active Problem List   Diagnosis Date Noted  . Abnormal Pap smear of cervix 11/08/2015  . 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 CESAREAN SECTION     2014    Current Outpatient Rx  . Order #: [id number] Class: Normal  . Order #: [id number] Class: Print  Provera  Allergies Motrin [ibuprofen]  Family History  Problem Relation Age of Onset  . Polycystic ovary syndrome Mother     Social History Social History  Substance Use Topics  . Smoking status: Former Smoker    Quit date: 01/01/2013  . Smokeless tobacco: Never Used  . Alcohol use No    Review of Systems  Constitutional: Negative for fever. Eyes: Negative for visual changes. ENT: Negative for sore throat. Cardiovascular: Negative for chest pain. Respiratory: Negative for shortness of breath. Gastrointestinal: Negative for abdominal pain, vomiting and diarrhea. Genitourinary: Negative for dysuria. Musculoskeletal: Negative for back pain. Skin: Negative for rash. Neurological: Negative for headache. 10 point Review of Systems otherwise negative ____________________________________________   PHYSICAL EXAM:  VITAL SIGNS: ED Triage  Vitals [02/24/16 1239]  Enc Vitals Group     BP 131/69     Pulse Rate 79     Resp      Temp 98.7 F (37.1 C)     Temp src      SpO2 98 %     Weight 202 lb (91.6 kg)     Height 5\' 9"  (1.753 m)     Head Circumference      Peak Flow      Pain Score 6     Pain Loc      Pain Edu?      Excl. in GC?      Constitutional: Alert and oriented. Well appearing and in no distress. HEENT   Head: Normocephalic and atraumatic.      Eyes: Conjunctivae are normal. PERRL. Normal extraocular movements.      Ears:         Nose: No congestion/rhinnorhea.   Mouth/Throat: Mucous membranes are moist.   Neck: No stridor. Cardiovascular/Chest: Normal cap refill. Respiratory: Normal respiratory effort without tachypnea nor retractions.  Gastrointestinal: Mildly obese  Genitourinary/rectal:Deferred Musculoskeletal: Nontender with normal range of motion in all extremities. No joint effusions.  No lower extremity tenderness.  No edema.  Right inner lower thigh area of cellulitis/redness and mildly tender. No fluctuance or induration. Neurologic:  Normal speech and language. No gross or focal neurologic deficits are appreciated. Skin:  Skin is warm, dry and intact. No rash noted. Psychiatric: Mood and affect are normal. Speech and behavior are normal. Patient exhibits appropriate insight and judgment.  ____________________________________________   EKG I, Governor Rooks, MD, the attending physician have personally viewed and interpreted  all ECGs.  None ____________________________________________  LABS (pertinent positives/negatives)  Labs Reviewed - No data to display  ____________________________________________  RADIOLOGY All Xrays were viewed by me. Imaging interpreted by Radiologist.  None __________________________________________  PROCEDURES  Procedure(s) performed: None  Critical Care performed: None  ____________________________________________   ED COURSE /  ASSESSMENT AND PLAN  Pertinent labs & imaging results that were available during my care of the patient were reviewed by me and considered in my medical decision making (see chart for details).    This patient was sent in apparently from urgent care for evaluation of cellulitis to her right inner thigh. It's about palm sized area, without any clear abscess. Discussed with her that I wouldn't recommend I&D at this point without an obvious abscess, but I will treat with clindamycin for slight redness.      CONSULTATIONS:   None Patient / Family / Caregiver informed of clinical course, medical decision-making process, and agree with plan.   I discussed return precautions, follow-up instructions, and discharged instructions with patient and/or family.   ___________________________________________   FINAL CLINICAL IMPRESSION(S) / ED DIAGNOSES   Final diagnoses:  Cellulitis of right lower extremity              Note: This dictation was prepared with Dragon dictation. Any transcriptional errors that result from this process are unintentional    Governor Rooks, MD 02/24/16 1451

## 2016-02-24 NOTE — ED Triage Notes (Signed)
Patient presents to the ED with burning pain, tenderness, swelling and redness to her right knee.  Patient states, "I thought it might be a spider bite or something but it's more than doubled in size since last night."  Patient now reports difficulty bending her knee.  Patient is afebrile at this time.  No obvious distress.

## 2016-02-24 NOTE — Discharge Instructions (Signed)
You were evaluated for a skin infection called cellulitis and are being treated with clindamycin capsule. You may open Nissen sprinkle it in order to take it.  Return to the emergency department for any worsening pain, redness, fever, dizziness, nausea and vomiting, or any other symptoms concerning to you. You need to see a physician in the next 1-2 days for recheck, certainly return if you have any worsening redness outside the area that has been marked.

## 2016-02-24 NOTE — Patient Instructions (Signed)
Follow up for Day 21 progesterone level in 1 weeks(02/25/2016)

## 2016-02-24 NOTE — ED Notes (Signed)
Pt sitting in room, family at bedside, pt awake and alert, pt in no acute distress

## 2016-02-24 NOTE — Progress Notes (Signed)
    GYNECOLOGY PROGRESS NOTE  Subjective:    Patient ID: Lindsay Carpenter, female    DOB: 08-28-1990, 25 y.o.   MRN: [id number]  HPI  Patient is a 25 y.o. 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  Patient reports starting Provera in June (6/6//17) and had withdrawal bleeding ~ 2-3 days after stopping medication (from 01/20/16, lasted 5 days) with very heavy flow.  Then notes having another episode of bleeding beginning 02/04/2016 that lasted for 4-5 days, like another period approximately 2 weeks later with normal flow.     The following portions of the patient's history were reviewed and updated as appropriate: allergies, current medications, past family history, past medical history, past social history, past surgical history and problem list.  Review of Systems Pertinent items noted in HPI and remainder of comprehensive ROS otherwise negative.   Objective:   Blood pressure 98/62, pulse (!) 59, height 5\' 8"  (1.727 m), weight 203 lb 11.2 oz (92.4 kg), last menstrual period 02/04/2016. General appearance: alert and no distress Exam deferred.    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  Will recheck progesterone levels after most recent cycle, Day #21 lab due on or around 02/25/16.  Continue timed coitus and monthly progesterone checks.  Will f/u in 3 months. Notes that her husband is in the [employer] and will be away for 6-8 weeks so cannot practice timed coitus.  Also states that she may be moving to a new state due to his job.  Will notify patient of progesterone levels by phone.  If medications for ovulation induction need to be started, can treat  until established with a new practitioner.  Patient can sign medical record release if moving is confirmed.    A total of 10 minutes were spent face-to-face with the patient during this encounter and over half of that time dealt with counseling and coordination of care.

## 2016-02-27 ENCOUNTER — Ambulatory Visit: Payer: BLUE CROSS/BLUE SHIELD | Admitting: Obstetrics and Gynecology

## 2016-04-13 ENCOUNTER — Telehealth: Payer: Self-pay | Admitting: Obstetrics and Gynecology

## 2016-04-13 NOTE — Telephone Encounter (Signed)
Called pt she states that she passed 2 small clots (slightly bigger than a quarter) and questions if this is normal. Advised pt that this can be normal with Provera treatment, also advised pt on true heavy bleeding precautions.

## 2016-04-13 NOTE — Telephone Encounter (Signed)
Pt called and she stated that when she took the provera last time and had her period she passed a lot of clots and tissue and wanted to know if this was normal or not.

## 2016-05-20 ENCOUNTER — Ambulatory Visit: Payer: Managed Care, Other (non HMO) | Admitting: Obstetrics and Gynecology

## 2017-03-22 ENCOUNTER — Telehealth: Payer: Self-pay | Admitting: Obstetrics and Gynecology

## 2017-03-22 NOTE — Telephone Encounter (Signed)
Patient LVM about records being sent to new office. Patient last seen 05/2016 - no release per [facility] - patient will need to come by and sign a ROI   03/22/2017  3 attempts to contact patient - it sound like someone answers and then silence X2  3rd call states patient is unavailable - no VM

## 2018-03-17 ENCOUNTER — Other Ambulatory Visit: Payer: Self-pay

## 2018-03-17 ENCOUNTER — Observation Stay
Admission: EM | Admit: 2018-03-17 | Discharge: 2018-03-17 | Disposition: A | Discharge status: Home / Self Care | Payer: Medicaid Other | Attending: Obstetrics and Gynecology | Admitting: Obstetrics and Gynecology

## 2018-03-17 DIAGNOSIS — Z3A24 24 weeks gestation of pregnancy: Secondary | ICD-10-CM | POA: Diagnosis not present

## 2018-03-17 DIAGNOSIS — O26892 Other specified pregnancy related conditions, second trimester: Principal | ICD-10-CM | POA: Insufficient documentation

## 2018-03-17 NOTE — OB Triage Note (Signed)
Pt arrived to unit with complaints of excess " milky white" discharge, spotting after intercourse and yellow mucus discharge. Also complaining of decreased fetal movement x3hours. Denies contractions. To assess.

## 2018-03-17 NOTE — Discharge Instructions (Signed)
Please choose an OB provider and schedule follow up care.   Fetal Movement Counts Patient Name: ________________________________________________ Patient Due Date: ____________________ What is a fetal movement count? A fetal movement count is the number of times that you feel your baby move during a certain amount of time. This may also be called a fetal kick count. A fetal movement count is recommended for every pregnant woman. You may be asked to start counting fetal movements as early as week 28 of your pregnancy. Pay attention to when your baby is most active. You may notice your baby's sleep and wake cycles. You may also notice things that make your baby move more. You should do a fetal movement count:  When your baby is normally most active.  At the same time each day.  A good time to count movements is while you are resting, after having something to eat and drink. How do I count fetal movements? 1. Find a quiet, comfortable area. Sit, or lie down on your side. 2. Write down the date, the start time and stop time, and the number of movements that you felt between those two times. Take this information with you to your health care visits. 3. For 2 hours, count kicks, flutters, swishes, rolls, and jabs. You should feel at least 10 movements during 2 hours. 4. You may stop counting after you have felt 10 movements. 5. If you do not feel 10 movements in 2 hours, have something to eat and drink. Then, keep resting and counting for 1 hour. If you feel at least 4 movements during that hour, you may stop counting. Contact a health care provider if:  You feel fewer than 4 movements in 2 hours.  Your baby is not moving like he or she usually does. Date: ____________ Start time: ____________ Stop time: ____________ Movements: ____________ Date: ____________ Start time: ____________ Stop time: ____________ Movements: ____________ Date: ____________ Start time: ____________ Stop time:  ____________ Movements: ____________ Date: ____________ Start time: ____________ Stop time: ____________ Movements: ____________ Date: ____________ Start time: ____________ Stop time: ____________ Movements: ____________ Date: ____________ Start time: ____________ Stop time: ____________ Movements: ____________ Date: ____________ Start time: ____________ Stop time: ____________ Movements: ____________ Date: ____________ Start time: ____________ Stop time: ____________ Movements: ____________ Date: ____________ Start time: ____________ Stop time: ____________ Movements: ____________ This information is not intended to replace advice given to you by your health care provider. Make sure you discuss any questions you have with your health care provider. Document Released: 08/19/2006 Document Revised: 03/18/2016 Document Reviewed: 08/29/2015 Elsevier Interactive Patient Education  2018 [employer].   Pregnancy and Sex Your sex life may change during pregnancy as well as after your newborn arrives. It is normal to have questions about sex during pregnancy. All women are affected differently by pregnancy hormones. You may notice an increase or decrease in your sexual drive throughout your pregnancy. Also, your partner's attitude and sexual drive may change. Share the information in this document with your partner. Talk openly about how you feel about sex. When is it safe to have sex during pregnancy? Sex is generally considered safe throughout a normal low-risk pregnancy. Remember:  The fetus is protected by the uterus and the fluid-filled sac that surrounds the fetus (amniotic sac).  The cervix is closed or sealed during pregnancy.  The penis does not reach or harm the fetus during sex.  Sex and orgasms are not thought to cause miscarriages or early labor.  If you use lubricants, use a water-soluble  product.  What risk factors make it unsafe to have sex while pregnant? The following  complications or risk factors may make it necessary to limit sexual activity:  You have a history of miscarriage or preterm labor.  You have bleeding, discharge, fluid leakage, or contractions.  Your placenta may be partially covering or completely covering the opening to the cervix (placenta previa).  Your cervix is weak and opens easily (incompetent cervix).  Your partner has an STD (sexually transmitted disease). Avoid sex with the infected person or use a condom to prevent infection to the fetus.  You are unsure of your partner's sexual history. Avoid sex or use condoms.  You are having twins, triples, or other multiples.  Your health care provider will help you determine whether sex during your pregnancy is safe. What practices are unsafe?  If you engage in oral sex, you should avoid having your partner blow air into your vagina. Although very rare, this can send a dangerous air bubble into your bloodstream.  Anal sex is generally safe during pregnancy, but there can be a risk of spreading bacteria from the rectum and aggravating any hemorrhoids. This information is not intended to replace advice given to you by your health care provider. Make sure you discuss any questions you have with your health care provider. Document Released: 01/07/2010 Document Revised: 03/17/2016 Document Reviewed: 01/09/2016 Elsevier Interactive Patient Education  2018 [employer]    Labor Induction Labor induction is when steps are taken to cause a pregnant woman to begin the labor process. Most women go into labor on their own between 37 weeks and 42 weeks of the pregnancy. When this does not happen or when there is a medical need, methods may be used to induce labor. Labor induction causes a pregnant woman's uterus to contract. It also causes the cervix to soften (ripen), open (dilate), and thin out (efface). Usually, labor is not induced before 39 weeks of the pregnancy unless there is a problem  with the baby or mother. Before inducing labor, your health care provider will consider a number of factors, including the following:  The medical condition of you and the baby.  How many weeks along you are.  The status of the babys lung maturity.  The condition of the cervix.  The position of the baby.  What are the reasons for labor induction? Labor may be induced for the following reasons:  The health of the baby or mother is at risk.  The pregnancy is overdue by 1 week or more.  The water breaks but labor does not start on its own.  The mother has a health condition or serious illness, such as high blood pressure, infection, placental abruption, or diabetes.  The amniotic fluid amounts are low around the baby.  The baby is distressed.  Convenience or wanting the baby to be born on a certain date is not a reason for inducing labor. What methods are used for labor induction? Several methods of labor induction may be used, such as:  Prostaglandin medicine. This medicine causes the cervix to dilate and ripen. The medicine will also start contractions. It can be taken by mouth or by inserting a suppository into the vagina.  Inserting a thin tube (catheter) with a balloon on the end into the vagina to dilate the cervix. Once inserted, the balloon is expanded with water, which causes the cervix to open.  Stripping the membranes. Your health care provider separates amniotic sac tissue from the cervix, causing  the cervix to be stretched and causing the release of a hormone called progesterone. This may cause the uterus to contract. It is often done during an office visit. You will be sent home to wait for the contractions to begin. You will then come in for an induction.  Breaking the water. Your health care provider makes a hole in the amniotic sac using a small instrument. Once the amniotic sac breaks, contractions should begin. This may still take hours to see an  effect.  Medicine to trigger or strengthen contractions. This medicine is given through an IV access tube inserted into a vein in your arm.  All of the methods of induction, besides stripping the membranes, will be done in the hospital. Induction is done in the hospital so that you and the baby can be carefully monitored. How long does it take for labor to be induced? Some inductions can take up to 2-3 days. Depending on the cervix, it usually takes less time. It takes longer when you are induced early in the pregnancy or if this is your first pregnancy. If a mother is still pregnant and the induction has been going on for 2-3 days, either the mother will be sent home or a cesarean delivery will be needed. What are the risks associated with labor induction? Some of the risks of induction include:  Changes in fetal heart rate, such as too high, too low, or erratic.  Fetal distress.  Chance of infection for the mother and baby.  Increased chance of having a cesarean delivery.  Breaking off (abruption) of the placenta from the uterus (rare).  Uterine rupture (very rare).  When induction is needed for medical reasons, the benefits of induction may outweigh the risks. What are some reasons for not inducing labor? Labor induction should not be done if:  It is shown that your baby does not tolerate labor.  You have had previous surgeries on your uterus, such as a myomectomy or the removal of fibroids.  Your placenta lies very low in the uterus and blocks the opening of the cervix (placenta previa).  Your baby is not in a head-down position.  The umbilical cord drops down into the birth canal in front of the baby. This could cut off the baby's blood and oxygen supply.  You have had a previous cesarean delivery.  There are unusual circumstances, such as the baby being extremely premature.  This information is not intended to replace advice given to you by your health care provider.  Make sure you discuss any questions you have with your health care provider. Document Released: 12/09/2006 Document Revised: 12/26/2015 Document Reviewed: 02/16/2013 Elsevier Interactive Patient Education  2017 [employer]

## 2018-03-21 NOTE — Discharge Summary (Signed)
[redacted] week EGa with possible Leakage of fluid  RN assesment no vaginal fluid c/w LOF .  reassurring fetal monitoring  D/C home

## 2018-04-06 ENCOUNTER — Encounter: Payer: Self-pay | Admitting: Obstetrics and Gynecology

## 2018-08-26 ENCOUNTER — Encounter: Payer: Self-pay | Admitting: Emergency Medicine

## 2018-08-26 ENCOUNTER — Emergency Department
Admission: EM | Admit: 2018-08-26 | Discharge: 2018-08-26 | Disposition: A | Discharge status: Home / Self Care | Payer: Medicaid Other | Attending: Emergency Medicine | Admitting: Emergency Medicine

## 2018-08-26 DIAGNOSIS — R109 Unspecified abdominal pain: Secondary | ICD-10-CM

## 2018-08-26 DIAGNOSIS — Z79899 Other long term (current) drug therapy: Secondary | ICD-10-CM | POA: Insufficient documentation

## 2018-08-26 DIAGNOSIS — Z87891 Personal history of nicotine dependence: Secondary | ICD-10-CM | POA: Diagnosis not present

## 2018-08-26 DIAGNOSIS — R197 Diarrhea, unspecified: Secondary | ICD-10-CM | POA: Diagnosis not present

## 2018-08-26 DIAGNOSIS — R1010 Upper abdominal pain, unspecified: Secondary | ICD-10-CM | POA: Diagnosis present

## 2018-08-26 LAB — URINALYSIS, COMPLETE (UACMP) WITH MICROSCOPIC
Bacteria, UA: NONE SEEN
Bilirubin Urine: NEGATIVE
Glucose, UA: NEGATIVE mg/dL
Ketones, ur: NEGATIVE mg/dL
Leukocytes, UA: NEGATIVE
Nitrite: NEGATIVE
Protein, ur: NEGATIVE mg/dL
Specific Gravity, Urine: 1.027 (ref 1.005–1.030)
pH: 5 (ref 5.0–8.0)

## 2018-08-26 LAB — COMPREHENSIVE METABOLIC PANEL WITH GFR
ALT: 14 U/L (ref 0–44)
AST: 14 U/L — ABNORMAL LOW (ref 15–41)
Albumin: 3.9 g/dL (ref 3.5–5.0)
Alkaline Phosphatase: 44 U/L (ref 38–126)
Anion gap: 5 (ref 5–15)
BUN: 15 mg/dL (ref 6–20)
CO2: 28 mmol/L (ref 22–32)
Calcium: 9 mg/dL (ref 8.9–10.3)
Chloride: 104 mmol/L (ref 98–111)
Creatinine, Ser: 0.83 mg/dL (ref 0.44–1.00)
GFR calc Af Amer: >60 mL/min
GFR calc non Af Amer: >60 mL/min
Glucose, Bld: 108 mg/dL — ABNORMAL HIGH (ref 70–99)
Potassium: 3.8 mmol/L (ref 3.5–5.1)
Sodium: 137 mmol/L (ref 135–145)
Total Bilirubin: 0.7 mg/dL (ref 0.3–1.2)
Total Protein: 7.9 g/dL (ref 6.5–8.1)

## 2018-08-26 LAB — CBC
HCT: 41.9 % (ref 36.0–46.0)
Hemoglobin: 13.6 g/dL (ref 12.0–15.0)
MCH: 28.6 pg (ref 26.0–34.0)
MCHC: 32.5 g/dL (ref 30.0–36.0)
MCV: 88 fL (ref 80.0–100.0)
Platelets: 328 K/uL (ref 150–400)
RBC: 4.76 MIL/uL (ref 3.87–5.11)
RDW: 12.7 % (ref 11.5–15.5)
WBC: 7.8 K/uL (ref 4.0–10.5)
nRBC: 0 % (ref 0.0–0.2)

## 2018-08-26 LAB — POCT PREGNANCY, URINE: Preg Test, Ur: NEGATIVE

## 2018-08-26 LAB — LIPASE, BLOOD: LIPASE: 22 U/L (ref 11–51)

## 2018-08-26 MED ORDER — SUCRALFATE 1 G PO TABS: 1.0000 g | ORAL_TABLET | Freq: Four times a day (QID) | ORAL | 0 refills | Status: AC

## 2018-08-26 MED ORDER — DICYCLOMINE HCL 20 MG PO TABS: 20.0000 mg | ORAL_TABLET | Freq: Three times a day (TID) | ORAL | 0 refills | Status: AC | PRN

## 2018-08-26 MED ORDER — SODIUM CHLORIDE 0.9% FLUSH: 3.0000 mL | Freq: Once | INTRAVENOUS | Status: DC

## 2018-08-26 NOTE — ED Triage Notes (Signed)
Pt states that she has been having diarrhea for one week.  Initially she states that she thought it was food poisoning.  Pt states that she has stomach ache from belly button up and frequent diarrhea (6x yesterday and 4x today).  Pt states that the diarrhea is watery.  No fever and no n/v with this.

## 2018-08-26 NOTE — ED Notes (Signed)

## 2018-08-26 NOTE — ED Provider Notes (Signed)
[facility] Emergency Department Provider Note  ____________________________________________   I have reviewed the triage vital signs and the nursing notes.   HISTORY  Chief Complaint Abdominal Pain and Diarrhea   History limited by: Not Limited   HPI Lindsay Carpenter is a 28 y.o. female who presents to the emergency department today because of concerns for diarrhea.  Patient states she has had diarrhea for almost 1 week.  She  will have multiple episodes of watery brown diarrhea per day.  This is accompanied by some upper abdominal discomfort.  It is not severe pain and she is able to sleep through the pain.  She did finish a course of antibiotics for a dental infection roughly 3 weeks ago.  She had similar symptoms once in the past but they only last for about 3 days.   Per medical record review patient has a history of irregular periods.   Past Medical History:  Diagnosis Date  . Abnormal Pap smear of cervix 11/08/2015  . BV (bacterial vaginosis)   . Irregular periods     Patient Active Problem List   Diagnosis Date Noted  . Labor and delivery, indication for care 03/17/2018  . Infertility management 02/24/2016  . Anovulatory 02/24/2016  . Abnormal Pap smear of cervix 11/08/2015  . 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 APPENDECTOMY    . CESAREAN SECTION     2014    Prior to Admission medications   Medication Sig Start Date End Date Taking? Authorizing Provider  medroxyPROGESTERone (PROVERA) 10 MG tablet Take 1 tablet (10 mg total) by mouth daily. 02/18/16   Hildred Laserherry, Anika, MD  Prenatal Vit-Fe Fumarate-FA (PRENATAL MULTIVITAMIN) TABS tablet Take 1 tablet by mouth daily at 12 noon.    [provider]    Allergies Motrin [ibuprofen]  Family History  Problem Relation Age of Onset  . Polycystic ovary syndrome Mother     Social History Social History   Tobacco Use  . Smoking  status: Former Smoker    Last attempt to quit: 01/01/2013    Years since quitting: 5.6  . Smokeless tobacco: Never Used  Substance Use Topics  . Alcohol use: No  . Drug use: No    Review of Systems Constitutional: No fever/chills Eyes: No visual changes. ENT: No sore throat. Cardiovascular: Denies chest pain. Respiratory: Denies shortness of breath. Gastrointestinal: Positive for upper abdominal discomfort. Positive for diarrhea.  Genitourinary: Negative for dysuria. Musculoskeletal: Negative for back pain. Skin: Negative for rash. Neurological: Negative for headaches, focal weakness or numbness.  ____________________________________________   PHYSICAL EXAM:  VITAL SIGNS: ED Triage Vitals  Enc Vitals Group     BP 08/26/18 1321 (!) 127/92     Pulse Rate 08/26/18 1321 75     Resp 08/26/18 1321 16     Temp 08/26/18 1321 98.3 F (36.8 C)     Temp Source 08/26/18 1321 Oral     SpO2 08/26/18 1321 97 %     Weight 08/26/18 1322 192 lb (87.1 kg)     Height 08/26/18 1322 5\' 8"  (1.727 m)     Head Circumference --      Peak Flow --      Pain Score 08/26/18 1322 4    Constitutional: Alert and oriented.  Eyes: Conjunctivae are normal.  ENT      Head: Normocephalic and atraumatic.      Nose: No congestion/rhinnorhea.      Mouth/Throat: Mucous  membranes are moist.      Neck: No stridor. Hematological/Lymphatic/Immunilogical: No cervical lymphadenopathy. Cardiovascular: Normal rate, regular rhythm.  No murmurs, rubs, or gallops.  Respiratory: Normal respiratory effort without tachypnea nor retractions. Breath sounds are clear and equal bilaterally. No wheezes/rales/rhonchi. Gastrointestinal: Soft and non tender. No rebound. No guarding.  Genitourinary: Deferred Musculoskeletal: Normal range of motion in all extremities. No lower extremity edema. Neurologic:  Normal speech and language. No gross focal neurologic deficits are appreciated.  Skin:  Skin is warm, dry and intact. No  rash noted. Psychiatric: Mood and affect are normal. Speech and behavior are normal. Patient exhibits appropriate insight and judgment.  ____________________________________________    LABS (pertinent positives/negatives)  Upreg negative Lipase 22 CBC wbc 7.8, hgb 13.6, plt 328 CMP wnl except glu 109, ast 14  ____________________________________________   EKG  None  ____________________________________________    RADIOLOGY  None  ____________________________________________   PROCEDURES  Procedures  ____________________________________________   INITIAL IMPRESSION / ASSESSMENT AND PLAN / ED COURSE  Pertinent labs & imaging results that were available during my care of the patient were reviewed by me and considered in my medical decision making (see chart for details).   Patient presented to the emergency department today because of concerns for diarrhea.  Patient did finish a course of antibiotics roughly 3 weeks ago.  Patient however without any leukocytosis.  At this point I would have a low suspicion for C. difficile.  I did discuss with patient that we could obtain stool samples however she was comfortable deferring.  Did discuss with patient that it could be due to recent antibiotic use did recommend probiotics.  ____________________________________________   FINAL CLINICAL IMPRESSION(S) / ED DIAGNOSES  Final diagnoses:  Diarrhea, unspecified type  Abdominal pain, unspecified abdominal location     Note: This dictation was prepared with Dragon dictation. Any transcriptional errors that result from this process are unintentional     Phineas Semen, MD 08/26/18 1735

## 2018-08-26 NOTE — ED Notes (Signed)
Recent antibiotics for tooth

## 2018-08-26 NOTE — Discharge Instructions (Addendum)
Please seek medical attention for any high fevers, chest pain, shortness of breath, change in behavior, persistent vomiting, bloody stool or any other new or concerning symptoms.  

## 2018-08-26 NOTE — ED Notes (Signed)
Pt c/o watery stools x 5 days approximately 6 episodes a day. Pt has had abx therapy x 7 days. In past 3 weeks. Pt states increase in sxs of epigastric pain and general abdominal cramping discoft that begins 30 minutes after eating.

## 2018-10-06 ENCOUNTER — Encounter: Payer: Self-pay | Admitting: *Deleted

## 2018-10-06 ENCOUNTER — Emergency Department
Admission: EM | Admit: 2018-10-06 | Discharge: 2018-10-06 | Disposition: A | Discharge status: Home / Self Care | Payer: Medicaid Other | Attending: Student in an Organized Health Care Education/Training Program | Admitting: Student in an Organized Health Care Education/Training Program

## 2018-10-06 ENCOUNTER — Emergency Department: Payer: Medicaid Other

## 2018-10-06 ENCOUNTER — Other Ambulatory Visit: Payer: Self-pay

## 2018-10-06 DIAGNOSIS — Y9301 Activity, walking, marching and hiking: Secondary | ICD-10-CM | POA: Diagnosis not present

## 2018-10-06 DIAGNOSIS — Y929 Unspecified place or not applicable: Secondary | ICD-10-CM | POA: Insufficient documentation

## 2018-10-06 DIAGNOSIS — S60222A Contusion of left hand, initial encounter: Secondary | ICD-10-CM | POA: Insufficient documentation

## 2018-10-06 DIAGNOSIS — W010XXA Fall on same level from slipping, tripping and stumbling without subsequent striking against object, initial encounter: Secondary | ICD-10-CM | POA: Diagnosis not present

## 2018-10-06 DIAGNOSIS — Z79899 Other long term (current) drug therapy: Secondary | ICD-10-CM | POA: Insufficient documentation

## 2018-10-06 DIAGNOSIS — Y998 Other external cause status: Secondary | ICD-10-CM | POA: Diagnosis not present

## 2018-10-06 DIAGNOSIS — Z87891 Personal history of nicotine dependence: Secondary | ICD-10-CM | POA: Diagnosis not present

## 2018-10-06 DIAGNOSIS — S6992XA Unspecified injury of left wrist, hand and finger(s), initial encounter: Secondary | ICD-10-CM | POA: Diagnosis present

## 2018-10-06 NOTE — ED Provider Notes (Signed)
[facility] Emergency Department Provider Note ____________________________________________  Time seen: 2257  I have reviewed the triage vital signs and the nursing notes.  HISTORY  Chief Complaint  Hand Pain  HPI Lindsay Carpenter is a 28 y.o. right-handed female presents to the ED for evaluation of left hand pain. She was attempting to walk her 3 large dogs, when the dogs' leashes got tangled, causing the patient to fall to the left side.  She landed on an outstretched left hand.  She presents now with pain primarily to the dorsal aspect of the first interspace.  She denies any other injury at this time.  She describes swelling to the dorsal hand and pain with composite fist.  Past Medical History:  Diagnosis Date  . Abnormal Pap smear of cervix 11/08/2015  . BV (bacterial vaginosis)   . Irregular periods     Patient Active Problem List   Diagnosis Date Noted  . Labor and delivery, indication for care 03/17/2018  . Infertility management 02/24/2016  . Anovulatory 02/24/2016  . Abnormal Pap smear of cervix 11/08/2015  . 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 APPENDECTOMY    . CESAREAN SECTION     2014    Prior to Admission medications   Medication Sig Start Date End Date Taking? Authorizing Provider  dicyclomine (BENTYL) 20 MG tablet Take 1 tablet (20 mg total) by mouth 3 (three) times daily as needed (abdominal pain). 08/26/18   Phineas Semen, MD  medroxyPROGESTERone (PROVERA) 10 MG tablet Take 1 tablet (10 mg total) by mouth daily. 02/18/16   Hildred Laser, MD  Prenatal Vit-Fe Fumarate-FA (PRENATAL MULTIVITAMIN) TABS tablet Take 1 tablet by mouth daily at 12 noon.    [provider]  sucralfate (CARAFATE) 1 g tablet Take 1 tablet (1 g total) by mouth 4 (four) times daily. 08/26/18   Phineas Semen, MD    Allergies Motrin [ibuprofen]  Family History  Problem Relation Age of Onset  .  Polycystic ovary syndrome Mother     Social History Social History   Tobacco Use  . Smoking status: Former Smoker    Last attempt to quit: 01/01/2013    Years since quitting: 5.7  . Smokeless tobacco: Never Used  Substance Use Topics  . Alcohol use: No  . Drug use: No    Review of Systems  Constitutional: Negative for fever. Cardiovascular: Negative for chest pain. Respiratory: Negative for shortness of breath. Musculoskeletal: Negative for back pain.  Hand pain as above. Skin: Negative for rash. Neurological: Negative for headaches, focal weakness or numbness. ____________________________________________  PHYSICAL EXAM:  VITAL SIGNS: ED Triage Vitals [10/06/18 2200]  Enc Vitals Group     BP (!) 150/98     Pulse Rate 89     Resp 18     Temp 98.4 F (36.9 C)     Temp Source Oral     SpO2 100 %     Weight      Height      Head Circumference      Peak Flow      Pain Score 5     Pain Loc      Pain Edu?      Excl. in GC?     Constitutional: Alert and oriented. Well appearing and in no distress. Head: Normocephalic and atraumatic. Eyes: Conjunctivae are normal. Normal extraocular movements Cardiovascular: Normal rate, regular rhythm. Normal distal pulses. Respiratory: Normal respiratory effort.  Musculoskeletal:  Tender without any obvious deformity or dislocation.  Patient with subtle soft tissue swelling over the dorsum of the left hand.  She is able to perform a composite fist without difficulty.  Nontender with normal range of motion in all extremities.  Neurologic:  Normal gross sensation. Normal intrinsic and opposition testing. No gross focal neurologic deficits are appreciated. Skin:  Skin is warm, dry and intact. No rash noted. ____________________________________________  PROCEDURES  Procedures Ace bandage ____________________________________________  INITIAL IMPRESSION / ASSESSMENT AND PLAN / ED COURSE  She with ED evaluation of left hand pain after  mechanical fall.  Patient is reassured by her negative x-ray at this time.  Symptoms are consistent with a hand contusion.  She is placed in Ace bandage for comfort, and given instruction on rice management of her hand contusion.  She will follow with primary provider or return to the ED as needed. ____________________________________________  FINAL CLINICAL IMPRESSION(S) / ED DIAGNOSES  Final diagnoses:  Contusion of left hand, initial encounter      Lissa Hoard, PA-C 10/06/18 2351    Willy Eddy, MD 10/09/18 1025

## 2018-10-06 NOTE — Discharge Instructions (Addendum)
Your exam and x-ray are negative for fracture or dislocation. Wear the ace bandage as needed for support. Apply ice to reduce pain and swelling. Follow-up with [facility] as needed. Take Tylenol for pain relief.

## 2019-04-01 IMAGING — CR DG HAND COMPLETE 3+V*L*
1 series · 3 of 3 positions shown · non-contrast
Comparison: None.

CLINICAL DATA: Trip and fall, LEFT hand pain.

EXAM:
LEFT HAND - COMPLETE 3+ VIEW

[Series 1: x hand pa left · 0.14mm/px · 3 of 3 slices shown]
[im 1/3]
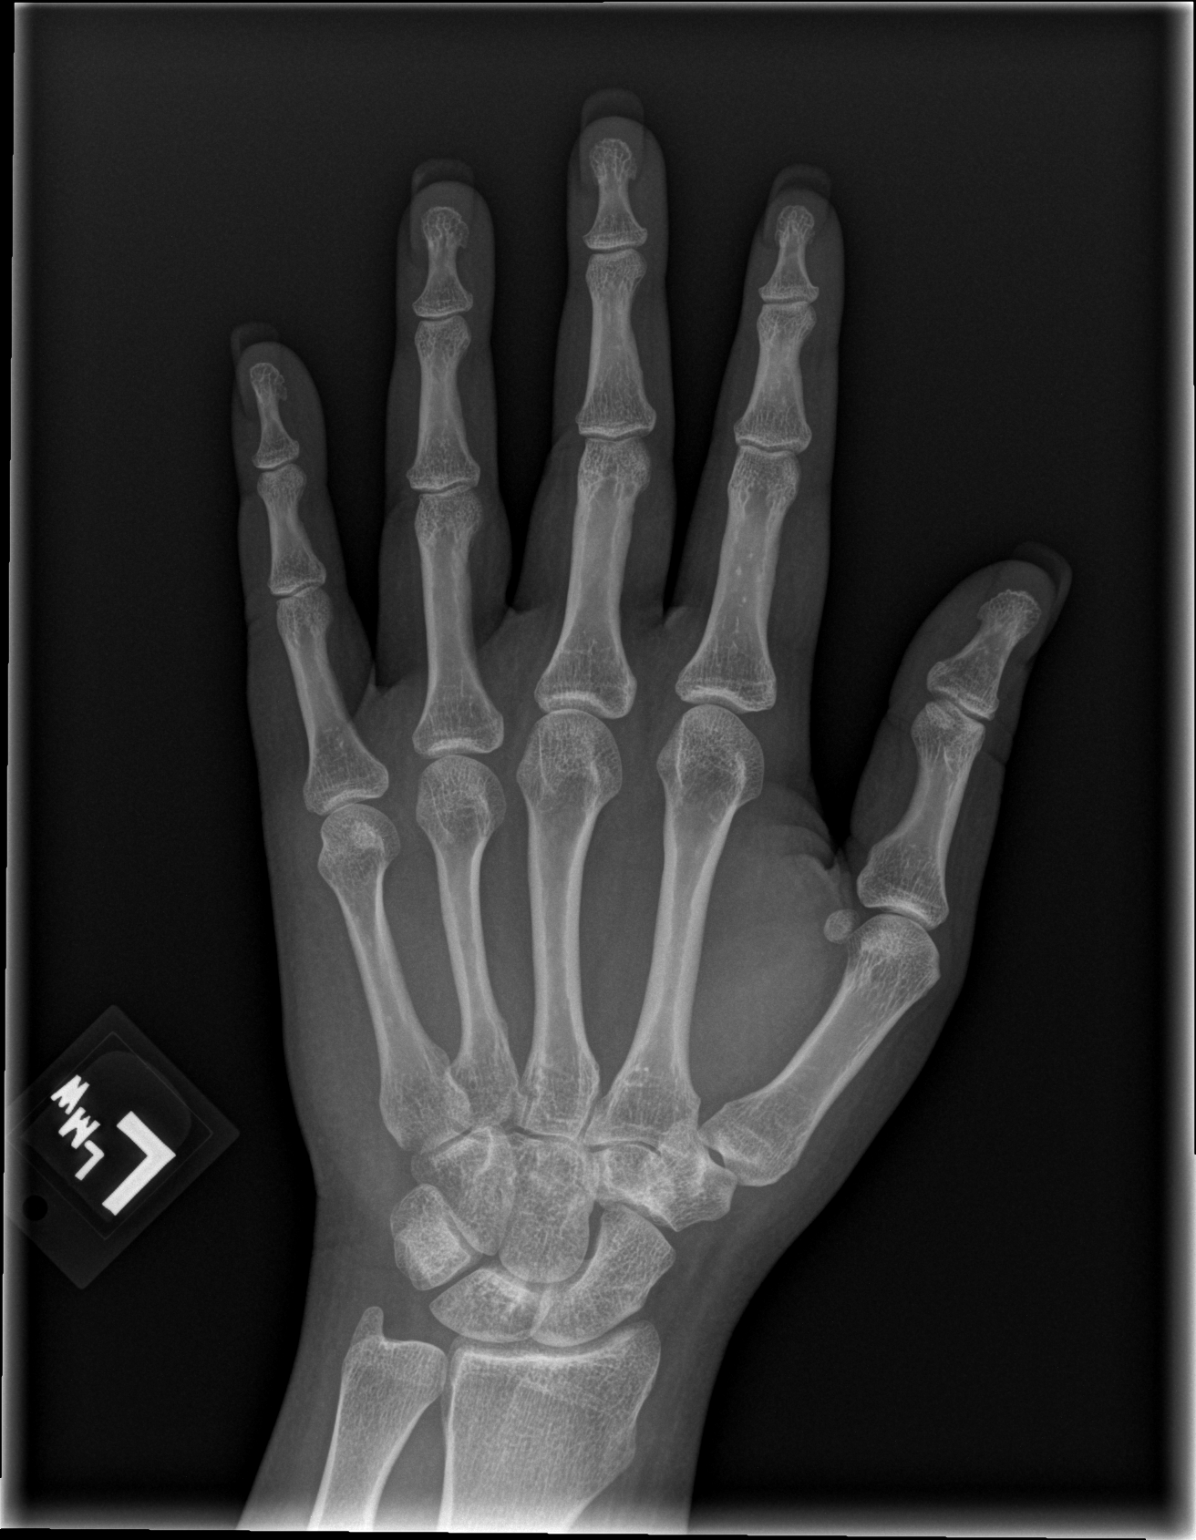
[im 2/3]
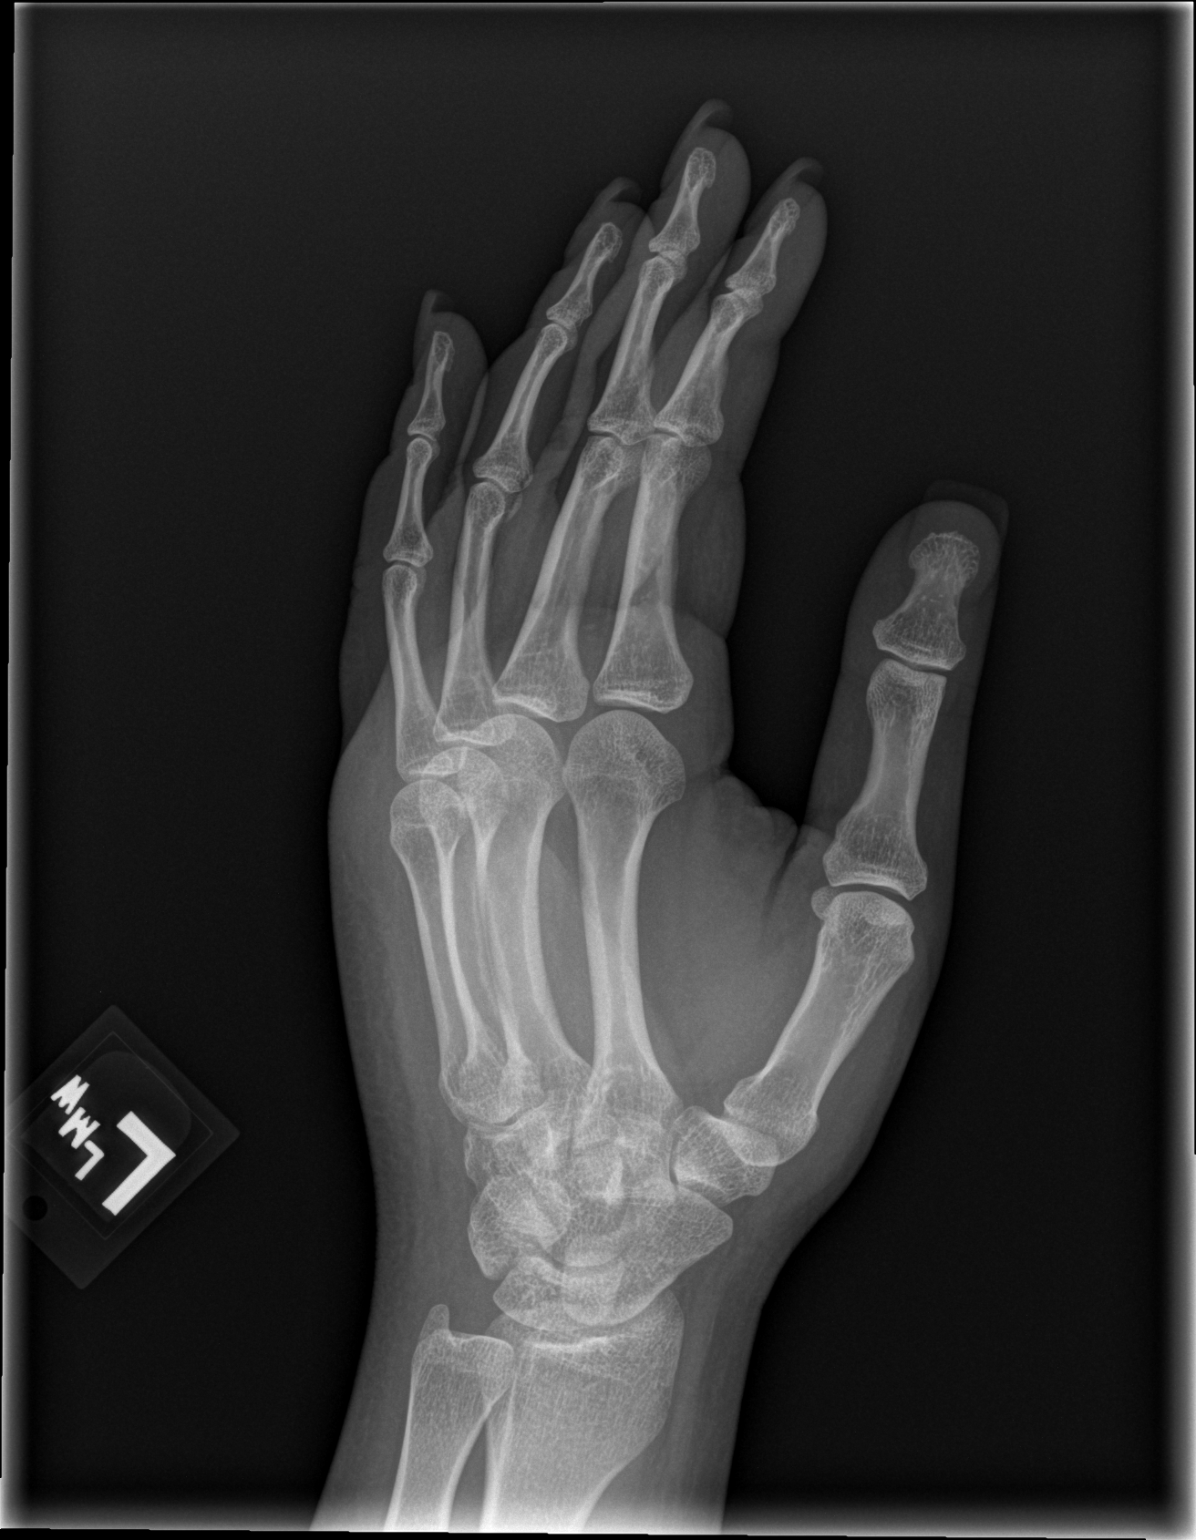
[im 3/3]
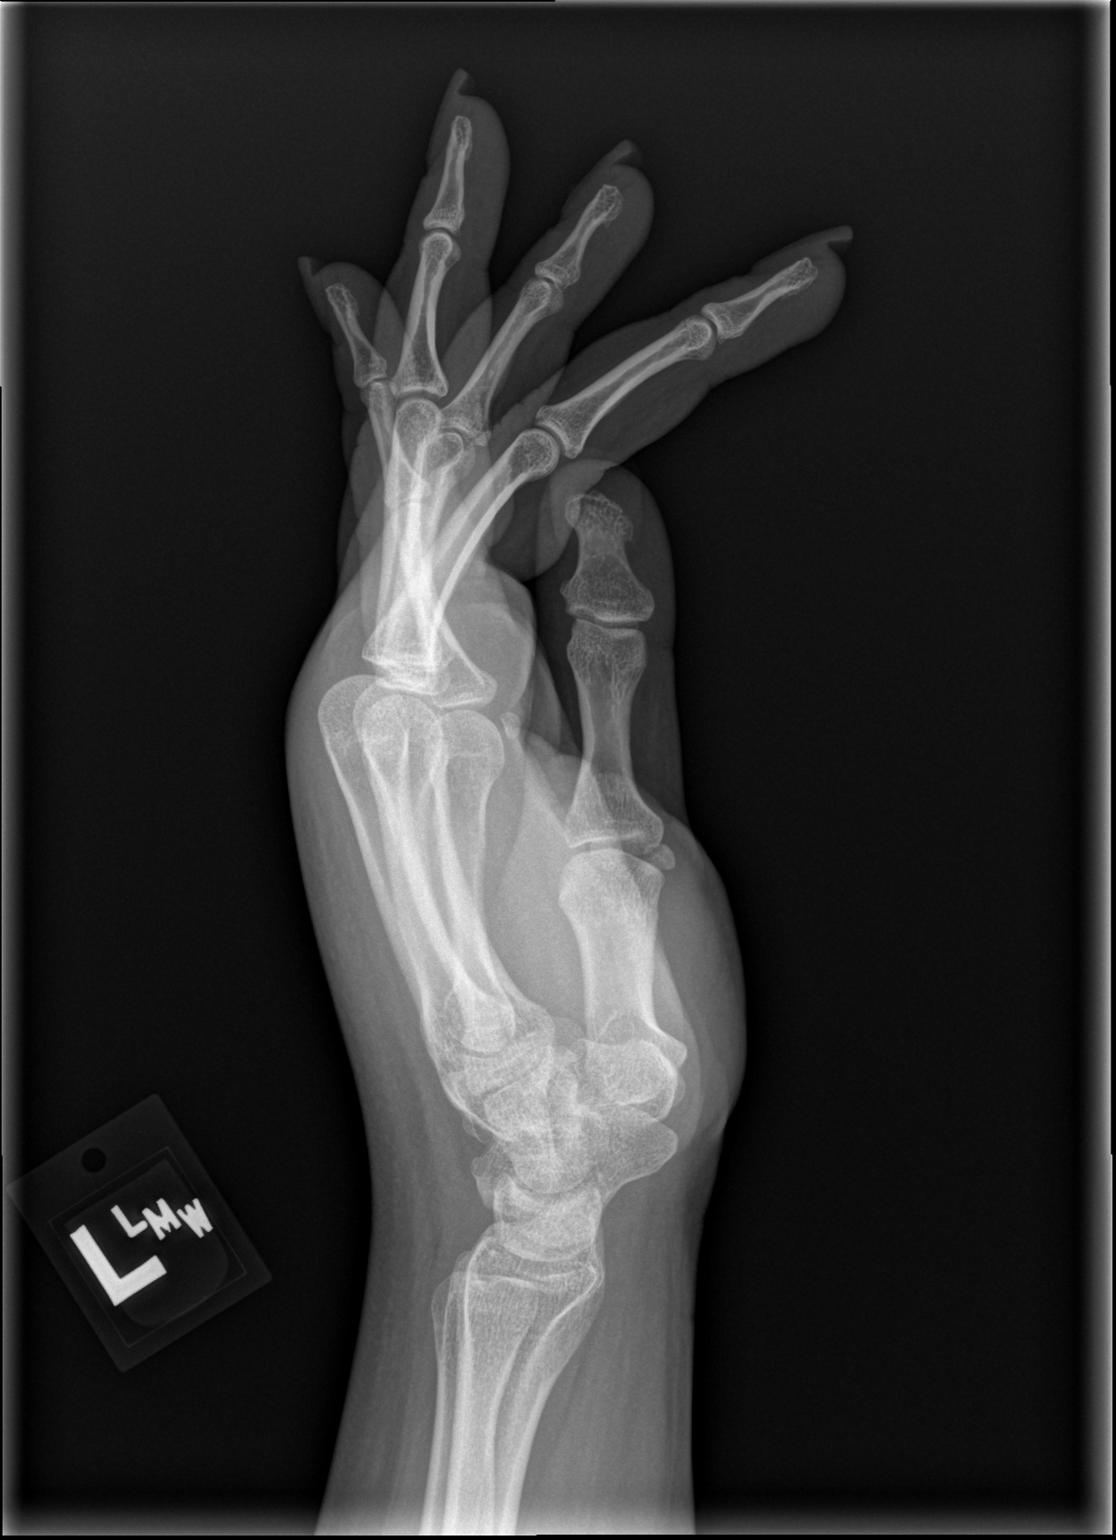

[3 of 3 positions shown; findings below may reference images not displayed]

FINDINGS: No acute fracture deformity or dislocation. No destructive bony
lesions. Dorsal hand soft tissue swelling without subcutaneous gas
or radiopaque foreign bodies.
IMPRESSION: Soft tissue swelling, no acute osseous.

## 2019-04-28 ENCOUNTER — Encounter: Payer: Medicaid Other | Admitting: Obstetrics and Gynecology

## 2019-05-17 ENCOUNTER — Other Ambulatory Visit: Payer: Self-pay

## 2019-05-17 ENCOUNTER — Other Ambulatory Visit (HOSPITAL_COMMUNITY)
Admission: RE | Admit: 2019-05-17 | Discharge: 2019-05-17 | Disposition: A | Discharge status: Home / Self Care | Payer: Medicaid Other | Source: Ambulatory Visit | Attending: Certified Nurse Midwife | Admitting: Certified Nurse Midwife

## 2019-05-17 ENCOUNTER — Ambulatory Visit (INDEPENDENT_AMBULATORY_CARE_PROVIDER_SITE_OTHER): Payer: Medicaid Other | Admitting: Certified Nurse Midwife

## 2019-05-17 ENCOUNTER — Encounter: Payer: Self-pay | Admitting: Certified Nurse Midwife

## 2019-05-17 VITALS — BP 107/67 | HR 55 | Ht 68.0 in | Wt 194.3 lb

## 2019-05-17 DIAGNOSIS — Z124 Encounter for screening for malignant neoplasm of cervix: Secondary | ICD-10-CM | POA: Diagnosis not present

## 2019-05-17 DIAGNOSIS — Z202 Contact with and (suspected) exposure to infections with a predominantly sexual mode of transmission: Secondary | ICD-10-CM | POA: Insufficient documentation

## 2019-05-17 DIAGNOSIS — F419 Anxiety disorder, unspecified: Secondary | ICD-10-CM | POA: Insufficient documentation

## 2019-05-17 DIAGNOSIS — Z0000 Encounter for general adult medical examination without abnormal findings: Secondary | ICD-10-CM | POA: Diagnosis not present

## 2019-05-17 DIAGNOSIS — Z01419 Encounter for gynecological examination (general) (routine) without abnormal findings: Secondary | ICD-10-CM | POA: Insufficient documentation

## 2019-05-17 DIAGNOSIS — Z13220 Encounter for screening for lipoid disorders: Secondary | ICD-10-CM

## 2019-05-17 DIAGNOSIS — J45909 Unspecified asthma, uncomplicated: Secondary | ICD-10-CM | POA: Insufficient documentation

## 2019-05-17 DIAGNOSIS — Z136 Encounter for screening for cardiovascular disorders: Secondary | ICD-10-CM

## 2019-05-17 NOTE — Progress Notes (Signed)
GYNECOLOGY ANNUAL PREVENTATIVE CARE ENCOUNTER NOTE  History:     Nataliee Shurtz is a 28 y.o. [phone] female here for a routine annual gynecologic exam.  Current complaints: recently split from her partner and found out that he cheated on her. Would like STD testing today.    Denies abnormal vaginal bleeding, discharge, pelvic pain, problems with intercourse or other gynecologic concerns. Is not currently sexually active.     Seperated, on child ( female 19 yrs old), recent loss 24 wks-placental abruption. ( in therapy 1 wk for grief counseling).   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   Gynecologic History Patient's last menstrual period was 05/05/2019 (exact date). Contraception: none Last Pap: 2-3 yrs ago Results were: abnormal per pt. States she was supposed to have repeated in 1 yr but did not.  Last mammogram: N/A   Obstetric History OB History  Gravida Para Term Preterm AB Living  4 2 1 1 2 1   SAB TAB Ectopic Multiple Live Births  2       2    # Outcome Date GA Lbr Len/2nd Weight Sex Delivery Anes PTL Lv  4 Preterm 03/20/18 [redacted]w[redacted]d       ND     Complications: Abruptio Placenta  3 Term 11/15/12 [redacted]w[redacted]d  9 lb 7 oz (4.281 kg) M CS-LTranv EPI N LIV  2 SAB 2011 [redacted]w[redacted]d         1 SAB 2010 [redacted]w[redacted]d           Past Medical History:  Diagnosis Date  . Abnormal Pap smear of cervix 11/08/2015  . BV (bacterial vaginosis)   . Irregular periods     Past Surgical History:  Procedure Laterality Date  . APPENDECTOMY    . CESAREAN SECTION     2014    Current Outpatient Medications on File Prior to Visit  Medication Sig Dispense Refill  . albuterol (VENTOLIN HFA) 108 (90 Base) MCG/ACT inhaler Inhale into the lungs.    . dicyclomine (BENTYL) 20 MG tablet Take 1 tablet (20 mg total) by mouth 3 (three) times daily as needed (abdominal pain). (Patient not taking: Reported on 05/17/2019) 30 tablet 0  . medroxyPROGESTERone (PROVERA) 10 MG tablet Take 1 tablet  (10 mg total) by mouth daily. (Patient not taking: Reported on 05/17/2019) 10 tablet 6  . Prenatal Vit-Fe Fumarate-FA (PRENATAL MULTIVITAMIN) TABS tablet Take 1 tablet by mouth daily at 12 noon.    . sucralfate (CARAFATE) 1 g tablet Take 1 tablet (1 g total) by mouth 4 (four) times daily. (Patient not taking: Reported on 05/17/2019) 60 tablet 0   No current facility-administered medications on file prior to visit.     Allergies  Allergen Reactions  . Motrin [Ibuprofen] Anaphylaxis    Social History:  reports that she quit smoking about 6 years ago. She has never used smokeless tobacco. She reports current alcohol use. She reports that she does not use drugs.  Family History  Problem Relation Age of Onset  . Polycystic ovary syndrome Mother   . Breast cancer Mother     The following portions of the patient's history were reviewed and updated as appropriate: allergies, current medications, past family history, past medical history, past social history, past surgical history and problem list.  Exercises 3 x wk for 45 min. Denies drugs, occasional alcohol and marijuana, denies smoking and vaping.   Review of Systems Pertinent items noted in HPI and remainder of comprehensive ROS otherwise negative. Has  history infertility /PCOS , lost 40 lbs and was able to get pregnant. Delivered 24 wks due to placental abruptions. Demised.  Physical Exam:  BP 107/67   Pulse (!) 55   Ht 5\' 8"  (1.727 m)   Wt 194 lb 5 oz (88.1 kg)   LMP 05/05/2019 (Exact Date)   BMI 29.55 kg/m  CONSTITUTIONAL: Well-developed, well-nourished female in no acute distress.  HENT:  Normocephalic, atraumatic, External right and left ear normal. Oropharynx is clear and moist EYES: Conjunctivae and EOM are normal. Pupils are equal, round, and reactive to light. No scleral icterus.  NECK: Normal range of motion, supple, no masses.  Normal thyroid.  SKIN: Skin is warm and dry. No rash noted. Not diaphoretic. No erythema. No  pallor. MUSCULOSKELETAL: Normal range of motion. No tenderness.  No cyanosis, clubbing, or edema.  2+ distal pulses. NEUROLOGIC: Alert and oriented to person, place, and time. Normal reflexes, muscle tone coordination. No cranial nerve deficit noted. PSYCHIATRIC: Normal mood and affect. Normal behavior. Normal judgment and thought content. CARDIOVASCULAR: Normal heart rate noted, regular rhythm RESPIRATORY: Clear to auscultation bilaterally. Effort and breath sounds normal, no problems with respiration noted. BREASTS: Symmetric in size. No masses, skin changes, nipple drainage, or lymphadenopathy. ABDOMEN: Soft, normal bowel sounds, no distention noted.  No tenderness, rebound or guarding.  PELVIC: Normal appearing external genitalia; normal appearing vaginal mucosa and cervix.  No abnormal discharge noted.  Pap smear obtained.contact bleeding, Swab for STD testing collected.  Normal uterine size, no other palpable masses, no uterine or adnexal tenderness.   Assessment and Plan:  Annual Well Women GYN Exam  Will follow up results of pap smear and manage accordingly. Mammogram not indicated Labs: Lipid profile, STD testing PT given information on [facility] testing, mother & grandmother have hx breast cancer. Has hx recurrent BV , boric acid sample given with instruction on use.  Routine preventative health maintenance measures emphasized. Please refer to After Visit Summary for other counseling recommendations.      Philip Aspen, CNM

## 2019-05-17 NOTE — Patient Instructions (Signed)
Preventive Care 21-29 Years Old, Female Preventive care refers to visits with your health care provider and lifestyle choices that can promote health and wellness. This includes:  A yearly physical exam. This may also be called an annual well check.  Regular dental visits and eye exams.  Immunizations.  Screening for certain conditions.  Healthy lifestyle choices, such as eating a healthy diet, getting regular exercise, not using drugs or products that contain nicotine and tobacco, and limiting alcohol use. What can I expect for my preventive care visit? Physical exam Your health care provider will check your:  Height and weight. This may be used to calculate body mass index (BMI), which tells if you are at a healthy weight.  Heart rate and blood pressure.  Skin for abnormal spots. Counseling Your health care provider may ask you questions about your:  Alcohol, tobacco, and drug use.  Emotional well-being.  Home and relationship well-being.  Sexual activity.  Eating habits.  Work and work environment.  Method of birth control.  Menstrual cycle.  Pregnancy history. What immunizations do I need?  Influenza (flu) vaccine  This is recommended every year. Tetanus, diphtheria, and pertussis (Tdap) vaccine  You may need a Td booster every 10 years. Varicella (chickenpox) vaccine  You may need this if you have not been vaccinated. Human papillomavirus (HPV) vaccine  If recommended by your health care provider, you may need three doses over 6 months. Measles, mumps, and rubella (MMR) vaccine  You may need at least one dose of MMR. You may also need a second dose. Meningococcal conjugate (MenACWY) vaccine  One dose is recommended if you are age 19-21 years and a first-year college student living in a residence hall, or if you have one of several medical conditions. You may also need additional booster doses. Pneumococcal conjugate (PCV13) vaccine  You may need  this if you have certain conditions and were not previously vaccinated. Pneumococcal polysaccharide (PPSV23) vaccine  You may need one or two doses if you smoke cigarettes or if you have certain conditions. Hepatitis A vaccine  You may need this if you have certain conditions or if you travel or work in places where you may be exposed to hepatitis A. Hepatitis B vaccine  You may need this if you have certain conditions or if you travel or work in places where you may be exposed to hepatitis B. Haemophilus influenzae type b (Hib) vaccine  You may need this if you have certain conditions. You may receive vaccines as individual doses or as more than one vaccine together in one shot (combination vaccines). Talk with your health care provider about the risks and benefits of combination vaccines. What tests do I need?  Blood tests  Lipid and cholesterol levels. These may be checked every 5 years starting at age 20.  Hepatitis C test.  Hepatitis B test. Screening  Diabetes screening. This is done by checking your blood sugar (glucose) after you have not eaten for a while (fasting).  Sexually transmitted disease (STD) testing.  BRCA-related cancer screening. This may be done if you have a family history of breast, ovarian, tubal, or peritoneal cancers.  Pelvic exam and Pap test. This may be done every 3 years starting at age 21. Starting at age 30, this may be done every 5 years if you have a Pap test in combination with an HPV test. Talk with your health care provider about your test results, treatment options, and if necessary, the need for more tests.   Follow these instructions at home: Eating and drinking   Eat a diet that includes fresh fruits and vegetables, whole grains, lean protein, and low-fat dairy.  Take vitamin and mineral supplements as recommended by your health care provider.  Do not drink alcohol if: ? Your health care provider tells you not to drink. ? You are  pregnant, may be pregnant, or are planning to become pregnant.  If you drink alcohol: ? Limit how much you have to 0-1 drink a day. ? Be aware of how much alcohol is in your drink. In the [place] one drink equals one 12 oz bottle of beer (355 mL), one 5 oz glass of wine (148 mL), or one 1 oz glass of hard liquor (44 mL). Lifestyle  Take daily care of your teeth and gums.  Stay active. Exercise for at least 30 minutes on 5 or more days each week.  Do not use any products that contain nicotine or tobacco, such as cigarettes, e-cigarettes, and chewing tobacco. If you need help quitting, ask your health care provider.  If you are sexually active, practice safe sex. Use a condom or other form of birth control (contraception) in order to prevent pregnancy and STIs (sexually transmitted infections). If you plan to become pregnant, see your health care provider for a preconception visit. What's next?  Visit your health care provider once a year for a well check visit.  Ask your health care provider how often you should have your eyes and teeth checked.  Stay up to date on all vaccines. This information is not intended to replace advice given to you by your health care provider. Make sure you discuss any questions you have with your health care provider. Document Released: 09/15/2001 Document Revised: 03/31/2018 Document Reviewed: 03/31/2018 Elsevier Patient Education  2020 Elsevier Inc.  

## 2019-05-22 LAB — LIPID PANEL
Chol/HDL Ratio: 3.2 ratio (ref 0.0–4.4)
Cholesterol, Total: 175 mg/dL (ref 100–199)
HDL: 54 mg/dL (ref >39)
LDL Chol Calc (NIH): 109 mg/dL — ABNORMAL HIGH (ref 0–99)
Triglycerides: 61 mg/dL (ref 0–149)
VLDL Cholesterol Cal: 12 mg/dL (ref 5–40)

## 2019-05-22 LAB — RPR: RPR Ser Ql: NONREACTIVE

## 2019-05-22 LAB — HSV 1 AND 2 IGM ABS, INDIRECT
HSV 1 IgM: <1:10 {titer}
HSV 2 IgM: <1:10 {titer}

## 2019-05-22 LAB — HEPATITIS B SURFACE ANTIGEN: Hepatitis B Surface Ag: NEGATIVE

## 2019-05-22 LAB — HIV ANTIBODY (ROUTINE TESTING W REFLEX): HIV Screen 4th Generation wRfx: NONREACTIVE

## 2019-05-23 ENCOUNTER — Telehealth: Payer: Self-pay

## 2019-05-23 ENCOUNTER — Other Ambulatory Visit: Payer: Self-pay | Admitting: Certified Nurse Midwife

## 2019-05-23 LAB — CERVICOVAGINAL ANCILLARY ONLY
Bacterial Vaginitis (gardnerella): POSITIVE — AB
Candida Glabrata: NEGATIVE
Candida Vaginitis: NEGATIVE
Chlamydia: NEGATIVE
Comment: NEGATIVE
Comment: NEGATIVE
Comment: NEGATIVE
Comment: NEGATIVE
Comment: NEGATIVE
Comment: NORMAL
Neisseria Gonorrhea: NEGATIVE
Trichomonas: NEGATIVE

## 2019-05-23 MED ORDER — METRONIDAZOLE 500 MG PO TABS: 500.0000 mg | ORAL_TABLET | Freq: Two times a day (BID) | ORAL | 0 refills | Status: AC

## 2019-05-23 NOTE — Telephone Encounter (Signed)
Patient informed of positive BV results and per AT treatment sent to pharmacy on file.

## 2019-05-23 NOTE — Progress Notes (Signed)
Vaginal swab positive for BV.   Philip Aspen, CNM

## 2019-05-23 NOTE — Telephone Encounter (Signed)
Informed patient of negative STD blood testing, informed of slightly elevated LDL- info on diet and exercise sent to patient. Also informed STD swab test results not back yet but after review she will be notified.

## 2019-05-26 ENCOUNTER — Telehealth: Payer: Self-pay | Admitting: Certified Nurse Midwife

## 2019-05-26 NOTE — Telephone Encounter (Signed)
The patient called and stated that she needs to speak with a nurse in regards to having some medication questions. Please advise.

## 2019-05-29 ENCOUNTER — Other Ambulatory Visit: Payer: Self-pay | Admitting: Certified Nurse Midwife

## 2019-05-29 ENCOUNTER — Telehealth: Payer: Self-pay

## 2019-05-29 LAB — CYTOLOGY - PAP
Comment: NEGATIVE
Comment: NEGATIVE
Diagnosis: NEGATIVE
HPV 16: NEGATIVE
HPV 18 / 45: NEGATIVE
High risk HPV: POSITIVE — AB

## 2019-05-29 MED ORDER — METRONIDAZOLE 0.75 % EX GEL: 1.0000 "application " | Freq: Every day | CUTANEOUS | 0 refills | Status: AC

## 2019-05-29 NOTE — Telephone Encounter (Signed)
Ivin Booty,   I sent in order for metro gel. Once daily at bed time for 5 days.   Thanks , Deneise Lever

## 2019-05-29 NOTE — Telephone Encounter (Signed)
Attempted to return patients call from 05/26/19. Recording states " the wireless caller you are attempting to reach is not available. Please try your call later."

## 2019-05-29 NOTE — Progress Notes (Signed)
Pt vomit flagyl ordered gel.   Philip Aspen, CNM

## 2019-05-29 NOTE — Telephone Encounter (Signed)
Patient called the office to report she vomited the flagyl and was wondering if there was something else she could take. She states the odor is gone but she still has the itching. Advised to get some OTC monistat, gyne-lotrimin or femstat. Please advise if there is anything else she can do.

## 2019-05-29 NOTE — Telephone Encounter (Signed)
Recording states the wireless customer you are calling is not available please try again later. Script is waiting for her at the pharmacy on file.

## 2019-05-30 ENCOUNTER — Other Ambulatory Visit: Payer: Self-pay | Admitting: Certified Nurse Midwife

## 2019-05-30 ENCOUNTER — Telehealth: Payer: Self-pay | Admitting: Certified Nurse Midwife

## 2019-05-30 ENCOUNTER — Telehealth: Payer: Self-pay

## 2019-05-30 NOTE — Telephone Encounter (Signed)
The wireless customer you are trying to call is not available. Please try your call later. Unable to inform patient of pap smear results.

## 2019-05-30 NOTE — Telephone Encounter (Signed)
The patient called and stated that she missed a call from [facility] And is requesting a call back. Please advise.

## 2019-05-30 NOTE — Telephone Encounter (Signed)
Informed patient of positive HPV results per AT. Explained HPV to patient and mailed a printout explaining HPV. All questions answered.

## 2019-06-07 ENCOUNTER — Telehealth: Payer: Self-pay | Admitting: Certified Nurse Midwife

## 2019-06-07 NOTE — Telephone Encounter (Signed)
The patient called and stated that she is needing to speak with her nurse in regards to her getting a different medication prescribed. Pt is requesting a call back. Please advise.

## 2019-06-09 ENCOUNTER — Telehealth: Payer: Self-pay

## 2019-06-09 NOTE — Telephone Encounter (Signed)
Attempted numerous times to contact patient- unable to leave a message

## 2020-05-21 ENCOUNTER — Encounter: Payer: Medicaid Other | Admitting: Certified Nurse Midwife
